# Patient Record
Sex: Female | Born: 1935 | Race: White | Hispanic: No | Smoking: Never smoker
Health system: Southern US, Community
[De-identification: ages and names within clinical notes are randomized; demographics above are authoritative.]

## PROBLEM LIST (undated history)

## (undated) DIAGNOSIS — R911 Solitary pulmonary nodule: Secondary | ICD-10-CM

## (undated) DIAGNOSIS — G629 Polyneuropathy, unspecified: Secondary | ICD-10-CM

## (undated) DIAGNOSIS — E785 Hyperlipidemia, unspecified: Secondary | ICD-10-CM

## (undated) DIAGNOSIS — E039 Hypothyroidism, unspecified: Secondary | ICD-10-CM

## (undated) HISTORY — PX: ABDOMINAL HYSTERECTOMY: SHX81

---
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
## 1988-10-04 HISTORY — PX: BREAST EXCISIONAL BIOPSY: SUR124

## 2015-08-08 ENCOUNTER — Other Ambulatory Visit: Payer: Self-pay | Admitting: Unknown Physician Specialty

## 2015-08-08 ENCOUNTER — Ambulatory Visit
Admission: RE | Admit: 2015-08-08 | Discharge: 2015-08-08 | Disposition: A | Discharge status: Home / Self Care | Payer: Medicare Other | Source: Ambulatory Visit | Attending: Unknown Physician Specialty | Admitting: Unknown Physician Specialty

## 2015-08-08 DIAGNOSIS — D175 Benign lipomatous neoplasm of intra-abdominal organs: Secondary | ICD-10-CM | POA: Insufficient documentation

## 2015-08-08 DIAGNOSIS — Z90710 Acquired absence of both cervix and uterus: Secondary | ICD-10-CM | POA: Insufficient documentation

## 2015-08-08 DIAGNOSIS — K5730 Diverticulosis of large intestine without perforation or abscess without bleeding: Secondary | ICD-10-CM | POA: Insufficient documentation

## 2015-08-08 DIAGNOSIS — K760 Fatty (change of) liver, not elsewhere classified: Secondary | ICD-10-CM | POA: Insufficient documentation

## 2015-08-08 DIAGNOSIS — R1031 Right lower quadrant pain: Secondary | ICD-10-CM

## 2015-08-08 LAB — POCT I-STAT CREATININE: Creatinine, Ser: 1.3 mg/dL — ABNORMAL HIGH (ref 0.44–1.00)

## 2015-08-08 MED ORDER — IOHEXOL 300 MG/ML  SOLN
100.0000 mL | Freq: Once | INTRAMUSCULAR | Status: AC | PRN
  Administered 2015-08-08: 100 mL via INTRAVENOUS

## 2015-08-11 DIAGNOSIS — I071 Rheumatic tricuspid insufficiency: Secondary | ICD-10-CM | POA: Insufficient documentation

## 2015-08-13 DIAGNOSIS — I10 Essential (primary) hypertension: Secondary | ICD-10-CM | POA: Insufficient documentation

## 2015-08-13 DIAGNOSIS — E782 Mixed hyperlipidemia: Secondary | ICD-10-CM | POA: Insufficient documentation

## 2015-08-21 DIAGNOSIS — R911 Solitary pulmonary nodule: Secondary | ICD-10-CM | POA: Insufficient documentation

## 2015-09-11 ENCOUNTER — Other Ambulatory Visit: Payer: Self-pay | Admitting: Specialist

## 2015-09-11 DIAGNOSIS — R918 Other nonspecific abnormal finding of lung field: Secondary | ICD-10-CM

## 2015-11-19 DIAGNOSIS — M19041 Primary osteoarthritis, right hand: Secondary | ICD-10-CM | POA: Insufficient documentation

## 2015-11-19 DIAGNOSIS — I8390 Asymptomatic varicose veins of unspecified lower extremity: Secondary | ICD-10-CM | POA: Insufficient documentation

## 2016-03-04 ENCOUNTER — Ambulatory Visit
Admission: RE | Admit: 2016-03-04 | Discharge: 2016-03-04 | Disposition: A | Discharge status: Home / Self Care | Payer: Medicare Other | Source: Ambulatory Visit | Attending: Specialist | Admitting: Specialist

## 2016-03-04 DIAGNOSIS — R918 Other nonspecific abnormal finding of lung field: Secondary | ICD-10-CM | POA: Diagnosis present

## 2016-03-04 DIAGNOSIS — J219 Acute bronchiolitis, unspecified: Secondary | ICD-10-CM | POA: Diagnosis not present

## 2016-03-04 DIAGNOSIS — J470 Bronchiectasis with acute lower respiratory infection: Secondary | ICD-10-CM | POA: Diagnosis not present

## 2016-03-04 DIAGNOSIS — I2510 Atherosclerotic heart disease of native coronary artery without angina pectoris: Secondary | ICD-10-CM | POA: Insufficient documentation

## 2016-03-10 ENCOUNTER — Other Ambulatory Visit: Payer: Self-pay | Admitting: Specialist

## 2016-03-10 DIAGNOSIS — J479 Bronchiectasis, uncomplicated: Secondary | ICD-10-CM

## 2016-03-10 DIAGNOSIS — R0602 Shortness of breath: Secondary | ICD-10-CM

## 2016-03-10 DIAGNOSIS — R918 Other nonspecific abnormal finding of lung field: Secondary | ICD-10-CM

## 2016-04-29 DIAGNOSIS — J479 Bronchiectasis, uncomplicated: Secondary | ICD-10-CM | POA: Insufficient documentation

## 2016-05-10 ENCOUNTER — Ambulatory Visit: Payer: Medicare Other

## 2016-06-01 ENCOUNTER — Other Ambulatory Visit: Payer: Self-pay | Admitting: Internal Medicine

## 2016-06-01 DIAGNOSIS — Z1231 Encounter for screening mammogram for malignant neoplasm of breast: Secondary | ICD-10-CM

## 2016-06-08 ENCOUNTER — Ambulatory Visit
Admission: RE | Admit: 2016-06-08 | Discharge: 2016-06-08 | Disposition: A | Discharge status: Home / Self Care | Payer: Medicare Other | Source: Ambulatory Visit | Attending: Specialist | Admitting: Specialist

## 2016-06-08 DIAGNOSIS — I2510 Atherosclerotic heart disease of native coronary artery without angina pectoris: Secondary | ICD-10-CM | POA: Diagnosis not present

## 2016-06-08 DIAGNOSIS — I700 Atherosclerosis of aorta: Secondary | ICD-10-CM | POA: Insufficient documentation

## 2016-06-08 DIAGNOSIS — J984 Other disorders of lung: Secondary | ICD-10-CM | POA: Diagnosis not present

## 2016-06-08 DIAGNOSIS — R918 Other nonspecific abnormal finding of lung field: Secondary | ICD-10-CM | POA: Diagnosis not present

## 2016-06-08 DIAGNOSIS — J479 Bronchiectasis, uncomplicated: Secondary | ICD-10-CM

## 2016-06-08 DIAGNOSIS — R0602 Shortness of breath: Secondary | ICD-10-CM | POA: Diagnosis present

## 2016-06-08 DIAGNOSIS — R05 Cough: Secondary | ICD-10-CM | POA: Diagnosis present

## 2016-06-15 ENCOUNTER — Ambulatory Visit
Admission: RE | Admit: 2016-06-15 | Discharge: 2016-06-15 | Disposition: A | Discharge status: Home / Self Care | Payer: Medicare Other | Source: Ambulatory Visit | Attending: Internal Medicine | Admitting: Internal Medicine

## 2016-06-15 ENCOUNTER — Other Ambulatory Visit: Payer: Self-pay | Admitting: Internal Medicine

## 2016-06-15 DIAGNOSIS — Z1231 Encounter for screening mammogram for malignant neoplasm of breast: Secondary | ICD-10-CM | POA: Diagnosis present

## 2016-06-18 ENCOUNTER — Ambulatory Visit: Payer: Medicare Other

## 2016-07-15 ENCOUNTER — Other Ambulatory Visit: Payer: Self-pay | Admitting: Specialist

## 2016-07-15 DIAGNOSIS — R911 Solitary pulmonary nodule: Secondary | ICD-10-CM

## 2016-08-05 ENCOUNTER — Ambulatory Visit: Payer: Medicare Other | Admitting: Occupational Therapy

## 2016-12-02 ENCOUNTER — Other Ambulatory Visit: Payer: Self-pay | Admitting: Specialist

## 2016-12-02 DIAGNOSIS — R911 Solitary pulmonary nodule: Secondary | ICD-10-CM

## 2017-04-05 ENCOUNTER — Ambulatory Visit: Payer: Medicare Other

## 2017-06-06 ENCOUNTER — Ambulatory Visit: Payer: Medicare Other

## 2017-06-07 ENCOUNTER — Ambulatory Visit
Admission: RE | Admit: 2017-06-07 | Discharge: 2017-06-07 | Disposition: A | Discharge status: Home / Self Care | Payer: Medicare Other | Source: Ambulatory Visit | Attending: Specialist | Admitting: Specialist

## 2017-06-07 DIAGNOSIS — I700 Atherosclerosis of aorta: Secondary | ICD-10-CM | POA: Diagnosis not present

## 2017-06-07 DIAGNOSIS — R911 Solitary pulmonary nodule: Secondary | ICD-10-CM | POA: Diagnosis present

## 2017-06-07 DIAGNOSIS — R918 Other nonspecific abnormal finding of lung field: Secondary | ICD-10-CM | POA: Diagnosis not present

## 2017-06-07 DIAGNOSIS — I2510 Atherosclerotic heart disease of native coronary artery without angina pectoris: Secondary | ICD-10-CM | POA: Diagnosis not present

## 2017-07-20 ENCOUNTER — Other Ambulatory Visit: Payer: Self-pay | Admitting: Internal Medicine

## 2017-07-20 DIAGNOSIS — Z1231 Encounter for screening mammogram for malignant neoplasm of breast: Secondary | ICD-10-CM

## 2017-08-03 ENCOUNTER — Ambulatory Visit
Admission: RE | Admit: 2017-08-03 | Discharge: 2017-08-03 | Disposition: A | Discharge status: Home / Self Care | Payer: Medicare Other | Source: Ambulatory Visit | Attending: Internal Medicine | Admitting: Internal Medicine

## 2017-08-03 DIAGNOSIS — Z1231 Encounter for screening mammogram for malignant neoplasm of breast: Secondary | ICD-10-CM | POA: Insufficient documentation

## 2017-08-04 ENCOUNTER — Other Ambulatory Visit: Payer: Self-pay | Admitting: Internal Medicine

## 2017-08-04 DIAGNOSIS — N6489 Other specified disorders of breast: Secondary | ICD-10-CM

## 2017-08-04 DIAGNOSIS — R928 Other abnormal and inconclusive findings on diagnostic imaging of breast: Secondary | ICD-10-CM

## 2017-08-12 ENCOUNTER — Ambulatory Visit
Admission: RE | Admit: 2017-08-12 | Discharge: 2017-08-12 | Disposition: A | Discharge status: Home / Self Care | Payer: Medicare Other | Source: Ambulatory Visit | Attending: Internal Medicine | Admitting: Internal Medicine

## 2017-08-12 DIAGNOSIS — R928 Other abnormal and inconclusive findings on diagnostic imaging of breast: Secondary | ICD-10-CM | POA: Insufficient documentation

## 2017-08-12 DIAGNOSIS — N6489 Other specified disorders of breast: Secondary | ICD-10-CM | POA: Diagnosis not present

## 2017-08-16 ENCOUNTER — Other Ambulatory Visit: Payer: Self-pay | Admitting: Internal Medicine

## 2017-08-16 DIAGNOSIS — R928 Other abnormal and inconclusive findings on diagnostic imaging of breast: Secondary | ICD-10-CM

## 2017-08-16 DIAGNOSIS — N6310 Unspecified lump in the right breast, unspecified quadrant: Secondary | ICD-10-CM

## 2017-08-18 ENCOUNTER — Other Ambulatory Visit: Payer: Self-pay | Admitting: *Deleted

## 2017-08-18 ENCOUNTER — Inpatient Hospital Stay
Admission: RE | Admit: 2017-08-18 | Discharge: 2017-08-18 | Disposition: A | Discharge status: Home / Self Care | Payer: Self-pay | Source: Ambulatory Visit | Attending: *Deleted | Admitting: *Deleted

## 2017-08-18 DIAGNOSIS — Z9289 Personal history of other medical treatment: Secondary | ICD-10-CM

## 2017-09-01 ENCOUNTER — Other Ambulatory Visit: Payer: Self-pay | Admitting: Internal Medicine

## 2017-09-01 ENCOUNTER — Ambulatory Visit
Admission: RE | Admit: 2017-09-01 | Discharge: 2017-09-01 | Disposition: A | Discharge status: Home / Self Care | Payer: Medicare Other | Source: Ambulatory Visit | Attending: Internal Medicine | Admitting: Internal Medicine

## 2017-09-01 DIAGNOSIS — R600 Localized edema: Secondary | ICD-10-CM

## 2017-09-01 DIAGNOSIS — I340 Nonrheumatic mitral (valve) insufficiency: Secondary | ICD-10-CM | POA: Insufficient documentation

## 2017-09-01 DIAGNOSIS — I8001 Phlebitis and thrombophlebitis of superficial vessels of right lower extremity: Secondary | ICD-10-CM | POA: Insufficient documentation

## 2017-09-02 ENCOUNTER — Ambulatory Visit
Admission: RE | Admit: 2017-09-02 | Discharge: 2017-09-02 | Disposition: A | Discharge status: Home / Self Care | Payer: Medicare Other | Source: Ambulatory Visit | Attending: Internal Medicine | Admitting: Internal Medicine

## 2017-09-02 DIAGNOSIS — N6310 Unspecified lump in the right breast, unspecified quadrant: Secondary | ICD-10-CM

## 2017-09-02 DIAGNOSIS — R928 Other abnormal and inconclusive findings on diagnostic imaging of breast: Secondary | ICD-10-CM

## 2017-12-25 ENCOUNTER — Emergency Department
Admission: EM | Admit: 2017-12-25 | Discharge: 2017-12-25 | Disposition: A | Discharge status: Home / Self Care | Payer: No Typology Code available for payment source | Attending: Emergency Medicine | Admitting: Emergency Medicine

## 2017-12-25 ENCOUNTER — Emergency Department: Payer: No Typology Code available for payment source

## 2017-12-25 DIAGNOSIS — Y999 Unspecified external cause status: Secondary | ICD-10-CM | POA: Insufficient documentation

## 2017-12-25 DIAGNOSIS — Y9389 Activity, other specified: Secondary | ICD-10-CM | POA: Diagnosis not present

## 2017-12-25 DIAGNOSIS — S299XXA Unspecified injury of thorax, initial encounter: Secondary | ICD-10-CM | POA: Diagnosis present

## 2017-12-25 DIAGNOSIS — S40212A Abrasion of left shoulder, initial encounter: Secondary | ICD-10-CM | POA: Diagnosis not present

## 2017-12-25 DIAGNOSIS — Y92410 Unspecified street and highway as the place of occurrence of the external cause: Secondary | ICD-10-CM | POA: Insufficient documentation

## 2017-12-25 DIAGNOSIS — S20219A Contusion of unspecified front wall of thorax, initial encounter: Secondary | ICD-10-CM

## 2017-12-25 DIAGNOSIS — S20211A Contusion of right front wall of thorax, initial encounter: Secondary | ICD-10-CM | POA: Insufficient documentation

## 2017-12-25 LAB — PROTIME-INR
INR: 1.06
Prothrombin Time: 13.7 seconds (ref 11.4–15.2)

## 2017-12-25 LAB — CBC WITH DIFFERENTIAL/PLATELET
Basophils Absolute: 0 10*3/uL (ref 0–0.1)
Basophils Relative: 0 %
Eosinophils Absolute: 0.1 10*3/uL (ref 0–0.7)
Eosinophils Relative: 1 %
HEMATOCRIT: 44.8 % (ref 35.0–47.0)
HEMOGLOBIN: 14.3 g/dL (ref 12.0–16.0)
LYMPHS PCT: 13 %
Lymphs Abs: 1.6 10*3/uL (ref 1.0–3.6)
MCH: 30.3 pg (ref 26.0–34.0)
MCHC: 32 g/dL (ref 32.0–36.0)
MCV: 94.8 fL (ref 80.0–100.0)
MONO ABS: 1 10*3/uL — AB (ref 0.2–0.9)
MONOS PCT: 8 %
NEUTROS ABS: 9.6 10*3/uL — AB (ref 1.4–6.5)
Neutrophils Relative %: 78 %
Platelets: 312 10*3/uL (ref 150–440)
RBC: 4.72 MIL/uL (ref 3.80–5.20)
RDW: 14.3 % (ref 11.5–14.5)
WBC: 12.3 10*3/uL — ABNORMAL HIGH (ref 3.6–11.0)

## 2017-12-25 LAB — BASIC METABOLIC PANEL
ANION GAP: 9 (ref 5–15)
BUN: 23 mg/dL — ABNORMAL HIGH (ref 6–20)
CHLORIDE: 104 mmol/L (ref 101–111)
CO2: 26 mmol/L (ref 22–32)
Calcium: 9.9 mg/dL (ref 8.9–10.3)
Creatinine, Ser: 1.48 mg/dL — ABNORMAL HIGH (ref 0.44–1.00)
GFR calc Af Amer: 37 mL/min — ABNORMAL LOW (ref >60)
GFR calc non Af Amer: 32 mL/min — ABNORMAL LOW (ref >60)
GLUCOSE: 112 mg/dL — AB (ref 65–99)
Potassium: 4.3 mmol/L (ref 3.5–5.1)
Sodium: 139 mmol/L (ref 135–145)

## 2017-12-25 MED ORDER — OXYCODONE-ACETAMINOPHEN 5-325 MG PO TABS: 1.0000 | ORAL_TABLET | ORAL | 0 refills | Status: DC | PRN

## 2017-12-25 MED ORDER — IOPAMIDOL (ISOVUE-300) INJECTION 61%
75.0000 mL | Freq: Once | INTRAVENOUS | Status: AC | PRN
  Administered 2017-12-25: 75 mL via INTRAVENOUS

## 2017-12-25 MED ORDER — ACETAMINOPHEN 325 MG PO TABS
650.0000 mg | ORAL_TABLET | Freq: Once | ORAL | Status: AC
  Administered 2017-12-25: 650 mg via ORAL
  Filled 2017-12-25: qty 2

## 2017-12-25 NOTE — ED Notes (Signed)
Pt discharged to home.  Discharge instructions reviewed.  Verbalized understanding.  No questions or concerns at this time.  Teach back verified.  Pt in NAD.  No items left in ED.   
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
## 2017-12-25 NOTE — ED Notes (Signed)
Pt given Incentive spirometer and instructed on how to use. Pt verbalized understanding.

## 2018-01-31 ENCOUNTER — Other Ambulatory Visit: Payer: Self-pay | Admitting: Neurology

## 2018-01-31 DIAGNOSIS — R404 Transient alteration of awareness: Secondary | ICD-10-CM

## 2018-02-03 ENCOUNTER — Ambulatory Visit
Admission: RE | Admit: 2018-02-03 | Discharge: 2018-02-03 | Disposition: A | Discharge status: Home / Self Care | Payer: Medicare Other | Source: Ambulatory Visit | Attending: Neurology | Admitting: Neurology

## 2018-02-03 DIAGNOSIS — R404 Transient alteration of awareness: Secondary | ICD-10-CM | POA: Insufficient documentation

## 2018-02-03 DIAGNOSIS — G319 Degenerative disease of nervous system, unspecified: Secondary | ICD-10-CM | POA: Diagnosis not present

## 2018-02-03 DIAGNOSIS — I739 Peripheral vascular disease, unspecified: Secondary | ICD-10-CM | POA: Insufficient documentation

## 2018-02-06 ENCOUNTER — Ambulatory Visit: Payer: No Typology Code available for payment source

## 2018-03-02 ENCOUNTER — Other Ambulatory Visit: Payer: Self-pay | Admitting: Internal Medicine

## 2018-03-02 ENCOUNTER — Ambulatory Visit: Payer: Medicare Other

## 2018-03-02 ENCOUNTER — Ambulatory Visit
Admission: RE | Admit: 2018-03-02 | Discharge: 2018-03-02 | Disposition: A | Discharge status: Home / Self Care | Payer: Medicare Other | Source: Ambulatory Visit | Attending: Internal Medicine | Admitting: Internal Medicine

## 2018-03-02 DIAGNOSIS — S0990XA Unspecified injury of head, initial encounter: Secondary | ICD-10-CM

## 2018-03-02 DIAGNOSIS — X58XXXA Exposure to other specified factors, initial encounter: Secondary | ICD-10-CM | POA: Diagnosis not present

## 2018-03-02 DIAGNOSIS — G319 Degenerative disease of nervous system, unspecified: Secondary | ICD-10-CM | POA: Insufficient documentation

## 2018-03-02 DIAGNOSIS — S0003XA Contusion of scalp, initial encounter: Secondary | ICD-10-CM | POA: Insufficient documentation

## 2018-05-03 DIAGNOSIS — N183 Chronic kidney disease, stage 3 unspecified: Secondary | ICD-10-CM | POA: Insufficient documentation

## 2018-08-10 ENCOUNTER — Other Ambulatory Visit: Payer: Self-pay | Admitting: Internal Medicine

## 2018-08-10 DIAGNOSIS — Z1231 Encounter for screening mammogram for malignant neoplasm of breast: Secondary | ICD-10-CM

## 2018-08-22 ENCOUNTER — Inpatient Hospital Stay
Admission: AD | Admit: 2018-08-22 | Discharge: 2018-08-25 | DRG: 194 | Disposition: A | Discharge status: Home Health | Payer: Medicare Other | Source: Ambulatory Visit | Attending: Internal Medicine | Admitting: Internal Medicine

## 2018-08-22 ENCOUNTER — Inpatient Hospital Stay: Payer: Medicare Other

## 2018-08-22 DIAGNOSIS — Z79890 Hormone replacement therapy (postmenopausal): Secondary | ICD-10-CM

## 2018-08-22 DIAGNOSIS — E785 Hyperlipidemia, unspecified: Secondary | ICD-10-CM | POA: Diagnosis present

## 2018-08-22 DIAGNOSIS — Z23 Encounter for immunization: Secondary | ICD-10-CM | POA: Diagnosis not present

## 2018-08-22 DIAGNOSIS — Z91018 Allergy to other foods: Secondary | ICD-10-CM | POA: Diagnosis not present

## 2018-08-22 DIAGNOSIS — I129 Hypertensive chronic kidney disease with stage 1 through stage 4 chronic kidney disease, or unspecified chronic kidney disease: Secondary | ICD-10-CM | POA: Diagnosis present

## 2018-08-22 DIAGNOSIS — N183 Chronic kidney disease, stage 3 (moderate): Secondary | ICD-10-CM | POA: Diagnosis present

## 2018-08-22 DIAGNOSIS — Z8249 Family history of ischemic heart disease and other diseases of the circulatory system: Secondary | ICD-10-CM

## 2018-08-22 DIAGNOSIS — Z79899 Other long term (current) drug therapy: Secondary | ICD-10-CM

## 2018-08-22 DIAGNOSIS — Z90710 Acquired absence of both cervix and uterus: Secondary | ICD-10-CM

## 2018-08-22 DIAGNOSIS — G9349 Other encephalopathy: Secondary | ICD-10-CM | POA: Diagnosis present

## 2018-08-22 DIAGNOSIS — Z91013 Allergy to seafood: Secondary | ICD-10-CM | POA: Diagnosis not present

## 2018-08-22 DIAGNOSIS — R911 Solitary pulmonary nodule: Secondary | ICD-10-CM | POA: Diagnosis present

## 2018-08-22 DIAGNOSIS — E039 Hypothyroidism, unspecified: Secondary | ICD-10-CM | POA: Diagnosis present

## 2018-08-22 DIAGNOSIS — J189 Pneumonia, unspecified organism: Secondary | ICD-10-CM | POA: Diagnosis present

## 2018-08-22 HISTORY — DX: Hyperlipidemia, unspecified: E78.5

## 2018-08-22 HISTORY — DX: Hypothyroidism, unspecified: E03.9

## 2018-08-22 HISTORY — DX: Solitary pulmonary nodule: R91.1

## 2018-08-22 LAB — CBC
HEMATOCRIT: 42.3 % (ref 36.0–46.0)
HEMOGLOBIN: 13.8 g/dL (ref 12.0–15.0)
MCH: 30.9 pg (ref 26.0–34.0)
MCHC: 32.6 g/dL (ref 30.0–36.0)
MCV: 94.6 fL (ref 80.0–100.0)
Platelets: 390 10*3/uL (ref 150–400)
RBC: 4.47 MIL/uL (ref 3.87–5.11)
RDW: 13.4 % (ref 11.5–15.5)
WBC: 31.2 10*3/uL — ABNORMAL HIGH (ref 4.0–10.5)
nRBC: 0 % (ref 0.0–0.2)

## 2018-08-22 LAB — COMPREHENSIVE METABOLIC PANEL
ALK PHOS: 212 U/L — AB (ref 38–126)
ALT: 23 U/L (ref 0–44)
AST: 41 U/L (ref 15–41)
Albumin: 2.6 g/dL — ABNORMAL LOW (ref 3.5–5.0)
Anion gap: 10 (ref 5–15)
BILIRUBIN TOTAL: 1.4 mg/dL — AB (ref 0.3–1.2)
BUN: 45 mg/dL — ABNORMAL HIGH (ref 8–23)
CALCIUM: 9.2 mg/dL (ref 8.9–10.3)
CO2: 27 mmol/L (ref 22–32)
CREATININE: 1.46 mg/dL — AB (ref 0.44–1.00)
Chloride: 101 mmol/L (ref 98–111)
GFR, EST AFRICAN AMERICAN: 38 mL/min — AB (ref >60)
GFR, EST NON AFRICAN AMERICAN: 33 mL/min — AB (ref >60)
Glucose, Bld: 113 mg/dL — ABNORMAL HIGH (ref 70–99)
Potassium: 3.5 mmol/L (ref 3.5–5.1)
SODIUM: 138 mmol/L (ref 135–145)
TOTAL PROTEIN: 7.1 g/dL (ref 6.5–8.1)

## 2018-08-22 MED ORDER — DOCUSATE SODIUM 100 MG PO CAPS: 100.0000 mg | ORAL_CAPSULE | Freq: Two times a day (BID) | ORAL | Status: DC | PRN

## 2018-08-22 MED ORDER — PNEUMOCOCCAL VAC POLYVALENT 25 MCG/0.5ML IJ INJ
0.5000 mL | INJECTION | INTRAMUSCULAR | Status: DC
  Filled 2018-08-22: qty 0.5

## 2018-08-22 MED ORDER — OXYBUTYNIN CHLORIDE ER 5 MG PO TB24
5.0000 mg | ORAL_TABLET | Freq: Every day | ORAL | Status: DC
  Filled 2018-08-22 (×3): qty 1

## 2018-08-22 MED ORDER — LEVOTHYROXINE SODIUM 100 MCG PO TABS
100.0000 ug | ORAL_TABLET | Freq: Every day | ORAL | Status: DC
  Administered 2018-08-23 – 2018-08-25 (×3): 100 ug via ORAL
  Filled 2018-08-22 (×3): qty 1

## 2018-08-22 MED ORDER — ATENOLOL 50 MG PO TABS
50.0000 mg | ORAL_TABLET | Freq: Every day | ORAL | Status: DC
  Administered 2018-08-23 – 2018-08-25 (×3): 50 mg via ORAL
  Filled 2018-08-22 (×4): qty 1

## 2018-08-22 MED ORDER — ATORVASTATIN CALCIUM 20 MG PO TABS
40.0000 mg | ORAL_TABLET | Freq: Every day | ORAL | Status: DC
  Administered 2018-08-23 – 2018-08-25 (×3): 40 mg via ORAL
  Filled 2018-08-22 (×3): qty 2

## 2018-08-22 MED ORDER — DEXTROSE 5 % IV SOLN
250.0000 mg | INTRAVENOUS | Status: DC
  Administered 2018-08-22: 22:00:00 250 mg via INTRAVENOUS
  Filled 2018-08-22 (×2): qty 250

## 2018-08-22 MED ORDER — HEPARIN SODIUM (PORCINE) 5000 UNIT/ML IJ SOLN
5000.0000 [IU] | Freq: Three times a day (TID) | INTRAMUSCULAR | Status: DC
  Administered 2018-08-22 – 2018-08-25 (×7): 5000 [IU] via SUBCUTANEOUS
  Filled 2018-08-22 (×7): qty 1

## 2018-08-22 MED ORDER — GUAIFENESIN-CODEINE 100-10 MG/5ML PO SOLN
5.0000 mL | Freq: Four times a day (QID) | ORAL | Status: DC | PRN
  Administered 2018-08-22 – 2018-08-24 (×4): 5 mL via ORAL
  Filled 2018-08-22 (×4): qty 5

## 2018-08-22 MED ORDER — OXYCODONE-ACETAMINOPHEN 5-325 MG PO TABS: 1.0000 | ORAL_TABLET | ORAL | Status: DC | PRN

## 2018-08-22 MED ORDER — ACETAMINOPHEN 325 MG PO TABS: 650.0000 mg | ORAL_TABLET | Freq: Four times a day (QID) | ORAL | Status: DC | PRN

## 2018-08-22 MED ORDER — SPIRONOLACTONE 25 MG PO TABS
25.0000 mg | ORAL_TABLET | Freq: Every day | ORAL | Status: DC
  Administered 2018-08-23 – 2018-08-25 (×3): 25 mg via ORAL
  Filled 2018-08-22 (×3): qty 1

## 2018-08-22 MED ORDER — SODIUM CHLORIDE 0.9 % IV SOLN
1.0000 g | INTRAVENOUS | Status: DC
  Administered 2018-08-22 – 2018-08-24 (×3): 1 g via INTRAVENOUS
  Filled 2018-08-22: qty 1
  Filled 2018-08-22: qty 10
  Filled 2018-08-22 (×2): qty 1

## 2018-08-22 NOTE — H&P (Signed)
[place] at [place] NAME: Jasmine Butler    MR#:  [id number]  DATE OF BIRTH:  1935-09-12  DATE OF ADMISSION:  08/22/2018  PRIMARY CARE PHYSICIAN: Baxter Hire, MD   REQUESTING/REFERRING PHYSICIAN: Edwina Barth  CHIEF COMPLAINT:  No chief complaint on file.   HISTORY OF PRESENT ILLNESS: Jasmine Butler  is a 83 y.o. female with a known history of hyperlipidemia, hypothyroidism, pulmonary nodule-was having her regular physical appointment with her primary care physician today.  She went for work-up last week to give her blood sample at her doctor's office.  At that time she was not feeling too good and she mentioned that to the staff, but she was sent home after getting the blood samples Today when she went to see Dr. Edwina Barth she complained that since last week she is having cough, generalized weakness, shortness of breath and now she is also having yellowish-greenish sputum production. He did an x-ray in the office which reported having pneumonia. She was not hypoxic or hypotensive but had some tachycardia and so PMD called in for direct admission. Patient denies any fever but she had some chills  PAST MEDICAL HISTORY:   Past Medical History:  Diagnosis Date  . Hyperlipidemia   . Hypothyroidism   . Pulmonary nodule     PAST SURGICAL HISTORY:  Past Surgical History:  Procedure Laterality Date  . ABDOMINAL HYSTERECTOMY    . BREAST BIOPSY Right 09/02/2017   Affirm x- path pending  . BREAST EXCISIONAL BIOPSY Right 1990   NEG    SOCIAL HISTORY:  Social History   Tobacco Use  . Smoking status: Never Smoker  . Smokeless tobacco: Never Used  Substance Use Topics  . Alcohol use: Not Currently    FAMILY HISTORY:  Family History  Problem Relation Age of Onset  . Hypertension Mother   . Breast cancer Neg Hx     DRUG ALLERGIES:  Allergies  Allergen Reactions  . Other     Chicken/poultry  . Shellfish Allergy     REVIEW OF SYSTEMS:    CONSTITUTIONAL: No fever, have fatigue or weakness.  EYES: No blurred or double vision.  EARS, NOSE, AND THROAT: No tinnitus or ear pain.  RESPIRATORY: Have cough, shortness of breath, no wheezing or hemoptysis.  CARDIOVASCULAR: No chest pain, orthopnea, edema.  GASTROINTESTINAL: No nausea, vomiting, diarrhea or abdominal pain.  GENITOURINARY: No dysuria, hematuria.  ENDOCRINE: No polyuria, nocturia,  HEMATOLOGY: No anemia, easy bruising or bleeding SKIN: No rash or lesion. MUSCULOSKELETAL: No joint pain or arthritis.   NEUROLOGIC: No tingling, numbness, weakness.  PSYCHIATRY: No anxiety or depression.   MEDICATIONS AT HOME:  Prior to Admission medications   Medication Sig Start Date End Date Taking? Authorizing Provider  atenolol (TENORMIN) 50 MG tablet Take 50 mg by mouth daily. 11/23/17   [provider]  atorvastatin (LIPITOR) 40 MG tablet Take 40 mg by mouth daily. 11/23/17   [provider]  oxybutynin (DITROPAN-XL) 5 MG 24 hr tablet Take 5 mg by mouth daily. 11/30/17   [provider]  oxyCODONE-acetaminophen (PERCOCET) 5-325 MG tablet Take 1 tablet by mouth every 4 (four) hours as needed for severe pain. 12/25/17   Schuyler Amor, MD  spironolactone (ALDACTONE) 25 MG tablet Take 25 mg by mouth daily. 11/30/17   [provider]  SYNTHROID 100 MCG tablet Take 1 tablet by mouth daily. 11/23/17   [provider]      PHYSICAL EXAMINATION:  VITAL SIGNS: Blood pressure (!) 92/59, pulse (!) 108, temperature 97.6 F (36.4 C), temperature source Oral, resp. rate 20, SpO2 92 %.  GENERAL:  83 y.o.-year-old patient lying in the bed with no acute distress.  EYES: Pupils equal, round, reactive to light and accommodation. No scleral icterus. Extraocular muscles intact.  HEENT: Head atraumatic, normocephalic. Oropharynx and nasopharynx clear.  NECK:  Supple, no jugular venous distention. No thyroid enlargement, no tenderness.  LUNGS: Normal  breath sounds bilaterally, no wheezing, some crepitation. No use of accessory muscles of respiration.  CARDIOVASCULAR: S1, S2 normal. No murmurs, rubs, or gallops.  ABDOMEN: Soft, nontender, nondistended. Bowel sounds present. No organomegaly or mass.  EXTREMITIES: No pedal edema, cyanosis, or clubbing.  NEUROLOGIC: Cranial nerves II through XII are intact. Muscle strength 4/5 in all extremities. Sensation intact. Gait not checked.  PSYCHIATRIC: The patient is alert and oriented x 3.  SKIN: No obvious rash, lesion, or ulcer.   LABORATORY PANEL:   CBC Recent Labs  Lab 08/22/18 1732  WBC 31.2*  HGB 13.8  HCT 42.3  PLT 390  MCV 94.6  MCH 30.9  MCHC 32.6  RDW 13.4   ------------------------------------------------------------------------------------------------------------------  Chemistries  Recent Labs  Lab 08/22/18 1732  NA 138  K 3.5  CL 101  CO2 27  GLUCOSE 113*  BUN 45*  CREATININE 1.46*  CALCIUM 9.2  AST 41  ALT 23  ALKPHOS 212*  BILITOT 1.4*   ------------------------------------------------------------------------------------------------------------------ CrCl cannot be calculated (Unknown ideal weight.). ------------------------------------------------------------------------------------------------------------------ No results for input(s): TSH, T4TOTAL, T3FREE, THYROIDAB in the last 72 hours.  Invalid input(s): FREET3   Coagulation profile No results for input(s): INR, PROTIME in the last 168 hours. ------------------------------------------------------------------------------------------------------------------- No results for input(s): DDIMER in the last 72 hours. -------------------------------------------------------------------------------------------------------------------  Cardiac Enzymes No results for input(s): CKMB, TROPONINI, MYOGLOBIN in the last 168 hours.  Invalid input(s):  CK ------------------------------------------------------------------------------------------------------------------ Invalid input(s): POCBNP  ---------------------------------------------------------------------------------------------------------------  Urinalysis No results found for: COLORURINE, APPEARANCEUR, LABSPEC, PHURINE, GLUCOSEU, HGBUR, BILIRUBINUR, KETONESUR, PROTEINUR, UROBILINOGEN, NITRITE, LEUKOCYTESUR   RADIOLOGY: Dg Chest 2 View  Result Date: 08/22/2018 CLINICAL DATA:  Pneumonia. EXAM: CHEST - 2 VIEW COMPARISON:  None. FINDINGS: A right upper lobe pneumonia is identified. The heart, hila, mediastinum, lungs, and pleura are otherwise unremarkable. IMPRESSION: Right middle lobe infiltrate, likely pneumonia. Recommend follow-up to resolution. Electronically Signed   By: Dorise Bullion III M.D   On: 08/22/2018 19:24    EKG: No orders found for this or any previous visit.  IMPRESSION AND PLAN:  *Community-acquired pneumonia Ceftriaxone and azithromycin Tylenol as needed for fever. Cough syrup for symptomatic relief.   *Hypertension Continue atenolol.  *Hyperlipidemia Continue atorvastatin.  *Hypothyroidism Continue levothyroxine.  *Chronic kidney disease stage III This appears to be at baseline currently as compared to previous records. Continue to monitor.  All the records are reviewed and case discussed with ED provider. Management plans discussed with the patient, family and they are in agreement.  CODE STATUS: Full code.    Code Status Orders  (From admission, onward)         Start     Ordered   08/22/18 1824  Full code  Continuous     08/22/18 1823        Code Status History    This patient has a current code status but no historical code status.       TOTAL TIME TAKING CARE OF THIS PATIENT: 50* minutes.    Vaughan Basta M.D on 08/22/2018   Between 7am to  6pm - Pager - [phone]  After 6pm go to [email or web address] -  password [email or web address] [place] [facility]  Office  [phone]  CC: Primary care physician; Baxter Hire, MD   Note: This dictation was prepared with Dragon dictation along with smaller phrase technology. Any transcriptional errors that result from this process are unintentional.

## 2018-08-22 NOTE — Progress Notes (Signed)
Family Meeting Note  Advance Directive:yes  Today a meeting took place with the Patient.   The following clinical team members were present during this meeting:MD  The following were discussed:Patient's diagnosis: Pneumonia, chronic renal failure, Patient's progosis: Unable to determine and Goals for treatment: Full Code  Additional follow-up to be provided: Primary care physician  Time spent during discussion:20 minutes  Vaughan Basta, MD

## 2018-08-23 ENCOUNTER — Other Ambulatory Visit: Payer: Self-pay

## 2018-08-23 LAB — BASIC METABOLIC PANEL
Anion gap: 9 (ref 5–15)
BUN: 43 mg/dL — AB (ref 8–23)
CALCIUM: 8.9 mg/dL (ref 8.9–10.3)
CO2: 27 mmol/L (ref 22–32)
CREATININE: 1.14 mg/dL — AB (ref 0.44–1.00)
Chloride: 103 mmol/L (ref 98–111)
GFR calc Af Amer: 51 mL/min — ABNORMAL LOW (ref >60)
GFR, EST NON AFRICAN AMERICAN: 44 mL/min — AB (ref >60)
GLUCOSE: 97 mg/dL (ref 70–99)
Potassium: 3.5 mmol/L (ref 3.5–5.1)
Sodium: 139 mmol/L (ref 135–145)

## 2018-08-23 LAB — CBC
HCT: 39.2 % (ref 36.0–46.0)
Hemoglobin: 13 g/dL (ref 12.0–15.0)
MCH: 31.2 pg (ref 26.0–34.0)
MCHC: 33.2 g/dL (ref 30.0–36.0)
MCV: 94 fL (ref 80.0–100.0)
PLATELETS: 380 10*3/uL (ref 150–400)
RBC: 4.17 MIL/uL (ref 3.87–5.11)
RDW: 13.2 % (ref 11.5–15.5)
WBC: 27 10*3/uL — ABNORMAL HIGH (ref 4.0–10.5)
nRBC: 0 % (ref 0.0–0.2)

## 2018-08-23 MED ORDER — AZITHROMYCIN 500 MG PO TABS
250.0000 mg | ORAL_TABLET | Freq: Every day | ORAL | Status: DC
  Administered 2018-08-23 – 2018-08-24 (×2): 250 mg via ORAL
  Filled 2018-08-23 (×2): qty 1

## 2018-08-23 MED ORDER — ENSURE ENLIVE PO LIQD
237.0000 mL | Freq: Two times a day (BID) | ORAL | Status: DC
  Administered 2018-08-24: 237 mL via ORAL

## 2018-08-23 MED ORDER — ADULT MULTIVITAMIN W/MINERALS CH
1.0000 | ORAL_TABLET | Freq: Every day | ORAL | Status: DC
  Administered 2018-08-23 – 2018-08-25 (×3): 1 via ORAL
  Filled 2018-08-23 (×3): qty 1

## 2018-08-23 MED ORDER — PNEUMOCOCCAL VAC POLYVALENT 25 MCG/0.5ML IJ INJ: 0.5000 mL | INJECTION | INTRAMUSCULAR | Status: DC

## 2018-08-23 NOTE — Care Management Note (Signed)
Case Management Note  Patient Details  Name: Jasmine Butler MRN: [id number] Date of Birth: [birth year]  Subjective/Objective: Admitted to [facility] with the diagnosis of pneumonia. Lives in an apartment at [place] x 3 years.  Last seen Dr. Harrel Lemon 08/20/18.  Jerene Dilling is l;isted as contact pwerson [phone]).                    Action/Plan: Will continue to follow for discharge plans, if needed.   Expected Discharge Date:  08/24/18               Expected Discharge Plan:     In-House Referral:    yes Discharge planning Services   yes  Post Acute Care Choice:    Choice offered to:     DME Arranged:    DME Agency:     HH Arranged:    HH Agency:     Status of Service:     If discussed at [employer], dates discussed:    Additional Comments:  Shelbie Ammons, RN MSN CCM Care Management [phone] 08/23/2018, 10:43 AM

## 2018-08-23 NOTE — Progress Notes (Signed)
Nutrition Brief Note  Patient identified on the Malnutrition Screening Tool (MST) Report  83 year old female with h/o CKD III admitted with pneumonia and confusion   Met with pt in room today. Pt reports good appetite and oral intake today and pta. Pt eating 75-100% of meals. Per chart, pt is weight stable. Pt reports she is allergic to poultry and shellfish. RD made note in [facility] about allergies. Pt declines oral nutrition supplements as she reports these are "artificial" and she prefers to eat real food.   Wt Readings from Last 15 Encounters:  08/23/18 87.6 kg  12/25/17 88.5 kg    Body mass index is 33.15 kg/m. Patient meets criteria for obesity based on current BMI. Per chart, pt is weight stable pta.   Current diet order is regular, patient is consuming approximately 75-100% of meals at this time. Labs and medications reviewed.   No nutrition interventions warranted at this time. If nutrition issues arise, please consult RD.   Koleen Distance MS, RD, LDN Pager #- [phone] Office#- [phone] After Hours Pager: [phone]

## 2018-08-23 NOTE — Progress Notes (Signed)
[place] at [facility]                                                                                                                                                                                  Patient Demographics   Rodneshia Greenhouse, is a 83 y.o. female, DOB - [birth year], ZCH:[id number]  Admit date - 08/22/2018   Admitting Physician Vaughan Basta, MD  Outpatient Primary MD for the patient is Baxter Hire, MD   LOS - 1  Subjective: Patient still with some confusion    Review of Systems:   CONSTITUTIONAL: Unable to provide due to confusion  Vitals:   Vitals:   08/22/18 1937 08/23/18 0511 08/23/18 0513 08/23/18 1005  BP: (!) 92/59 106/81  114/64  Pulse: (!) 108 (!) 107  89  Resp: 20 20  20   Temp: 97.6 F (36.4 C) 98 F (36.7 C)  98.2 F (36.8 C)  TempSrc: Oral Oral  Oral  SpO2: 92% 91%  92%  Weight:   87.6 kg   Height:   5\' 4"  (1.626 m)     Wt Readings from Last 3 Encounters:  08/23/18 87.6 kg  12/25/17 88.5 kg     Intake/Output Summary (Last 24 hours) at 08/23/2018 1407 Last data filed at 08/23/2018 0300 Gross per 24 hour  Intake 217.15 ml  Output -  Net 217.15 ml    Physical Exam:   GENERAL: Pleasant-appearing in no apparent distress.  HEAD, EYES, EARS, NOSE AND THROAT: Atraumatic, normocephalic. Extraocular muscles are intact. Pupils equal and reactive to light. Sclerae anicteric. No conjunctival injection. No oro-pharyngeal erythema.  NECK: Supple. There is no jugular venous distention. No bruits, no lymphadenopathy, no thyromegaly.  HEART: Regular rate and rhythm,. No murmurs, no rubs, no clicks.  LUNGS: Decreased breath sounds without accessory muscle usage  aBDOMEN: Soft, flat, nontender, nondistended. Has good bowel sounds. No hepatosplenomegaly appreciated.  EXTREMITIES: No evidence of any cyanosis, clubbing, or peripheral edema.  +2 pedal and radial pulses bilaterally.  NEUROLOGIC: The patient is alert, awake,  and oriented x3 with no focal motor or sensory deficits appreciated bilaterally.  SKIN: Moist and warm with no rashes appreciated.  Psych: Not anxious, depressed LN: No inguinal LN enlargement    Antibiotics   Anti-infectives (From admission, onward)   Start     Dose/Rate Route Frequency Ordered Stop   08/23/18 1800  azithromycin (ZITHROMAX) tablet 250 mg     250 mg Oral Daily 08/23/18 1211     08/22/18 1815  cefTRIAXone (ROCEPHIN) 1 g in sodium chloride 0.9 % 100 mL IVPB     1 g 200 mL/hr over 30 Minutes Intravenous Every  24 hours 08/22/18 1814     08/22/18 1815  azithromycin (ZITHROMAX) 250 mg in dextrose 5 % 125 mL IVPB  Status:  Discontinued     250 mg 125 mL/hr over 60 Minutes Intravenous Every 24 hours 08/22/18 1814 08/23/18 1211      Medications   Scheduled Meds: . atenolol  50 mg Oral Daily  . atorvastatin  40 mg Oral Daily  . azithromycin  250 mg Oral Daily  . feeding supplement (ENSURE ENLIVE)  237 mL Oral BID BM  . heparin  5,000 Units Subcutaneous Q8H  . levothyroxine  100 mcg Oral Q0600  . multivitamin with minerals  1 tablet Oral Daily  . [START ON 08/24/2018] pneumococcal 23 valent vaccine  0.5 mL Intramuscular Tomorrow-1000  . spironolactone  25 mg Oral Daily   Continuous Infusions: . cefTRIAXone (ROCEPHIN)  IV Stopped (08/23/18 0600)   PRN Meds:.acetaminophen, docusate sodium, guaiFENesin-codeine, oxyCODONE-acetaminophen   Data Review:   Micro Results No results found for this or any previous visit (from the past 240 hour(s)).  Radiology Reports Dg Chest 2 View  Result Date: 08/22/2018 CLINICAL DATA:  Pneumonia. EXAM: CHEST - 2 VIEW COMPARISON:  None. FINDINGS: A right upper lobe pneumonia is identified. The heart, hila, mediastinum, lungs, and pleura are otherwise unremarkable. IMPRESSION: Right middle lobe infiltrate, likely pneumonia. Recommend follow-up to resolution. Electronically Signed   By: Dorise Bullion III M.D   On: 08/22/2018 19:24      CBC Recent Labs  Lab 08/22/18 1732 08/23/18 0459  WBC 31.2* 27.0*  HGB 13.8 13.0  HCT 42.3 39.2  PLT 390 380  MCV 94.6 94.0  MCH 30.9 31.2  MCHC 32.6 33.2  RDW 13.4 13.2    Chemistries  Recent Labs  Lab 08/22/18 1732 08/23/18 0459  NA 138 139  K 3.5 3.5  CL 101 103  CO2 27 27  GLUCOSE 113* 97  BUN 45* 43*  CREATININE 1.46* 1.14*  CALCIUM 9.2 8.9  AST 41  --   ALT 23  --   ALKPHOS 212*  --   BILITOT 1.4*  --    ------------------------------------------------------------------------------------------------------------------ estimated creatinine clearance is 42.2 mL/min (A) (by C-G formula based on SCr of 1.14 mg/dL (H)). ------------------------------------------------------------------------------------------------------------------ No results for input(s): HGBA1C in the last 72 hours. ------------------------------------------------------------------------------------------------------------------ No results for input(s): CHOL, HDL, LDLCALC, TRIG, CHOLHDL, LDLDIRECT in the last 72 hours. ------------------------------------------------------------------------------------------------------------------ No results for input(s): TSH, T4TOTAL, T3FREE, THYROIDAB in the last 72 hours.  Invalid input(s): FREET3 ------------------------------------------------------------------------------------------------------------------ No results for input(s): VITAMINB12, FOLATE, FERRITIN, TIBC, IRON, RETICCTPCT in the last 72 hours.  Coagulation profile No results for input(s): INR, PROTIME in the last 168 hours.  No results for input(s): DDIMER in the last 72 hours.  Cardiac Enzymes No results for input(s): CKMB, TROPONINI, MYOGLOBIN in the last 168 hours.  Invalid input(s): CK ------------------------------------------------------------------------------------------------------------------ Invalid input(s): [place]  Patient is a 83 year old admitted  with pneumonia   *Community-acquired pneumonia Continue ceftriaxone and azithromycin Tylenol as needed for fever. Cough syrup for symptomatic relief.  *Acute encephalopathy due to pneumonia continue supportive care    *Hypertension Continue atenolol.  Heart pressure was low yesterday but today stable  *Hyperlipidemia Continue atorvastatin.  *Hypothyroidism Continue levothyroxine.  *Chronic kidney disease stage III This appears to be at baseline currently as compared to previous records. Continue to monitor.       Code Status Orders  (From admission, onward)         Start  Ordered   08/22/18 1824  Full code  Continuous     08/22/18 1823        Code Status History    This patient has a current code status but no historical code status.           Consults none  DVT Prophylaxis heparin  Lab Results  Component Value Date   PLT 380 08/23/2018     Time Spent in minutes 35 minutes greater than 50% of time spent in care coordination and counseling patient regarding the condition and plan of care.   Dustin Flock M.D on 08/23/2018 at 2:07 PM  Between 7am to 6pm - Pager - [phone]  After 6pm go to [email or web address] - Proofreader  [employer]   Office  [phone]

## 2018-08-24 LAB — CBC
HCT: 38.1 % (ref 36.0–46.0)
Hemoglobin: 12.3 g/dL (ref 12.0–15.0)
MCH: 31 pg (ref 26.0–34.0)
MCHC: 32.3 g/dL (ref 30.0–36.0)
MCV: 96 fL (ref 80.0–100.0)
NRBC: 0.1 % (ref 0.0–0.2)
PLATELETS: 392 10*3/uL (ref 150–400)
RBC: 3.97 MIL/uL (ref 3.87–5.11)
RDW: 13.3 % (ref 11.5–15.5)
WBC: 21.6 10*3/uL — ABNORMAL HIGH (ref 4.0–10.5)

## 2018-08-24 LAB — PROCALCITONIN: Procalcitonin: 1.03 ng/mL

## 2018-08-24 MED ORDER — PNEUMOCOCCAL 13-VAL CONJ VACC IM SUSP
0.5000 mL | INTRAMUSCULAR | Status: DC
  Filled 2018-08-24: qty 0.5

## 2018-08-24 NOTE — Progress Notes (Addendum)
Patient mildly agitated and anxious this morning. Intermittently hostile with family "Anne Ng" on the telephone after RN assisted patient with dialing the number. Requesting her daughter be present or a familiar face. Requested Chaplain for prayers. Consult placed. Patient repeatedly instructing her friend "Anne Ng" on the telephone to not give up, to please believe she is who she is, and that she is in danger. Also asked Anne Ng to please get to [place]. Attempted to console patient and contact Anderson Malta.

## 2018-08-24 NOTE — Progress Notes (Signed)
[place] at [facility]                                                                                                                                                                                  Patient Demographics   Jasmine Butler, is a 83 y.o. female, DOB - Sep 21, 1935, OAC:[id number]  Admit date - 08/22/2018   Admitting Physician Vaughan Basta, MD  Outpatient Primary MD for the patient is Baxter Hire, MD   LOS - 2  Subjective: Patient continues to be confused has some paranoid thinking Clinically improved   Review of Systems:   CONSTITUTIONAL: Unable to provide due to confusion  Vitals:   Vitals:   08/24/18 0509 08/24/18 0510 08/24/18 0802 08/24/18 0930  BP: (!) 109/56  110/60   Pulse: 70 70 67 (!) 108  Resp:      Temp: 97.8 F (36.6 C)  (!) 97.5 F (36.4 C)   TempSrc: Oral  Oral   SpO2: (!) 89% 90% 92% 96%  Weight:      Height:        Wt Readings from Last 3 Encounters:  08/23/18 87.6 kg  12/25/17 88.5 kg     Intake/Output Summary (Last 24 hours) at 08/24/2018 1257 Last data filed at 08/24/2018 1018 Gross per 24 hour  Intake 0 ml  Output -  Net 0 ml    Physical Exam:   GENERAL: Pleasant-appearing in no apparent distress.  HEAD, EYES, EARS, NOSE AND THROAT: Atraumatic, normocephalic. Extraocular muscles are intact. Pupils equal and reactive to light. Sclerae anicteric. No conjunctival injection. No oro-pharyngeal erythema.  NECK: Supple. There is no jugular venous distention. No bruits, no lymphadenopathy, no thyromegaly.  HEART: Regular rate and rhythm,. No murmurs, no rubs, no clicks.  LUNGS: Decreased breath sounds without accessory muscle usage  aBDOMEN: Soft, flat, nontender, nondistended. Has good bowel sounds. No hepatosplenomegaly appreciated.  EXTREMITIES: No evidence of any cyanosis, clubbing, or peripheral edema.  +2 pedal and radial pulses bilaterally.  NEUROLOGIC: The patient is alert, awake, and  oriented x3 with no focal motor or sensory deficits appreciated bilaterally.  SKIN: Moist and warm with no rashes appreciated.  Psych: Not anxious, depressed LN: No inguinal LN enlargement    Antibiotics   Anti-infectives (From admission, onward)   Start     Dose/Rate Route Frequency Ordered Stop   08/23/18 1800  azithromycin (ZITHROMAX) tablet 250 mg     250 mg Oral Daily 08/23/18 1211 08/28/18 1759   08/22/18 1815  cefTRIAXone (ROCEPHIN) 1 g in sodium chloride 0.9 % 100 mL IVPB     1 g 200 mL/hr over 30 Minutes Intravenous Every 24  hours 08/22/18 1814 08/27/18 1814   08/22/18 1815  azithromycin (ZITHROMAX) 250 mg in dextrose 5 % 125 mL IVPB  Status:  Discontinued     250 mg 125 mL/hr over 60 Minutes Intravenous Every 24 hours 08/22/18 1814 08/23/18 1211      Medications   Scheduled Meds: . atenolol  50 mg Oral Daily  . atorvastatin  40 mg Oral Daily  . azithromycin  250 mg Oral Daily  . feeding supplement (ENSURE ENLIVE)  237 mL Oral BID BM  . heparin  5,000 Units Subcutaneous Q8H  . levothyroxine  100 mcg Oral Q0600  . multivitamin with minerals  1 tablet Oral Daily  . [START ON 08/25/2018] pneumococcal 13-valent conjugate vaccine  0.5 mL Intramuscular Tomorrow-1000  . spironolactone  25 mg Oral Daily   Continuous Infusions: . cefTRIAXone (ROCEPHIN)  IV Stopped (08/23/18 1800)   PRN Meds:.acetaminophen, docusate sodium, guaiFENesin-codeine, oxyCODONE-acetaminophen   Data Review:   Micro Results No results found for this or any previous visit (from the past 240 hour(s)).  Radiology Reports Dg Chest 2 View  Result Date: 08/22/2018 CLINICAL DATA:  Pneumonia. EXAM: CHEST - 2 VIEW COMPARISON:  None. FINDINGS: A right upper lobe pneumonia is identified. The heart, hila, mediastinum, lungs, and pleura are otherwise unremarkable. IMPRESSION: Right middle lobe infiltrate, likely pneumonia. Recommend follow-up to resolution. Electronically Signed   By: Dorise Bullion III  M.D   On: 08/22/2018 19:24     CBC Recent Labs  Lab 08/22/18 1732 08/23/18 0459 08/24/18 0614  WBC 31.2* 27.0* 21.6*  HGB 13.8 13.0 12.3  HCT 42.3 39.2 38.1  PLT 390 380 392  MCV 94.6 94.0 96.0  MCH 30.9 31.2 31.0  MCHC 32.6 33.2 32.3  RDW 13.4 13.2 13.3    Chemistries  Recent Labs  Lab 08/22/18 1732 08/23/18 0459  NA 138 139  K 3.5 3.5  CL 101 103  CO2 27 27  GLUCOSE 113* 97  BUN 45* 43*  CREATININE 1.46* 1.14*  CALCIUM 9.2 8.9  AST 41  --   ALT 23  --   ALKPHOS 212*  --   BILITOT 1.4*  --    ------------------------------------------------------------------------------------------------------------------ estimated creatinine clearance is 42.2 mL/min (A) (by C-G formula based on SCr of 1.14 mg/dL (H)). ------------------------------------------------------------------------------------------------------------------ No results for input(s): HGBA1C in the last 72 hours. ------------------------------------------------------------------------------------------------------------------ No results for input(s): CHOL, HDL, LDLCALC, TRIG, CHOLHDL, LDLDIRECT in the last 72 hours. ------------------------------------------------------------------------------------------------------------------ No results for input(s): TSH, T4TOTAL, T3FREE, THYROIDAB in the last 72 hours.  Invalid input(s): FREET3 ------------------------------------------------------------------------------------------------------------------ No results for input(s): VITAMINB12, FOLATE, FERRITIN, TIBC, IRON, RETICCTPCT in the last 72 hours.  Coagulation profile No results for input(s): INR, PROTIME in the last 168 hours.  No results for input(s): DDIMER in the last 72 hours.  Cardiac Enzymes No results for input(s): CKMB, TROPONINI, MYOGLOBIN in the last 168 hours.  Invalid input(s):  CK ------------------------------------------------------------------------------------------------------------------ Invalid input(s): [place]  Patient is a 83 year old admitted with pneumonia   *Community-acquired pneumonia Continue ceftriaxone and azithromycin Tylenol as needed for fever. Cough syrup for symptomatic relief.  *Acute encephalopathy due to pneumonia continue supportive care    *Hypertension Continue atenolol.  Heart pressure was low yesterday but today stable  *Hyperlipidemia Continue atorvastatin.  *Hypothyroidism Continue levothyroxine.  *Chronic kidney disease stage III This appears to be at baseline currently as compared to previous records. Continue to monitor.       Code Status Orders  (From admission, onward)  Start     Ordered   08/22/18 1824  Full code  Continuous     08/22/18 1823        Code Status History    This patient has a current code status but no historical code status.           Consults none  DVT Prophylaxis heparin  Lab Results  Component Value Date   PLT 392 08/24/2018     Time Spent in minutes 35 minutes greater than 50% of time spent in care coordination and counseling patient regarding the condition and plan of care.   Dustin Flock M.D on 08/24/2018 at 12:57 PM  Between 7am to 6pm - Pager - [phone]  After 6pm go to [email or web address] - Proofreader  [employer]   Office  [phone]

## 2018-08-24 NOTE — Progress Notes (Signed)
Patient very confused and agitated this shift. She has clarity intermittently but overall remains paranoid. Requires frequent reorientation. Will continue to monitor.

## 2018-08-24 NOTE — Progress Notes (Signed)
   08/24/18 1100  Clinical Encounter Type  Visited With Patient;Other (Comment) Idelle Crouch)  Visit Type Initial;Other (Comment) (OR for Prayer)  Referral From Nurse  Recommendations Follow-up, if requested.  Spiritual Encounters  Spiritual Needs Emotional   Chaplain responded to OR for prayer. The request was received at 1038.  Upon arrival, Chaplain discovered patient meeting with her priest. Patient complained that a request for prayer was made around 0700 and wanted to know where the Chaplain had been. Chaplain apologized that her request was delayed and turned pastoral care over to her priest. OR is closed.

## 2018-08-24 NOTE — Evaluation (Signed)
Physical Therapy Evaluation Patient Details Name: Jasmine Butler MRN: [id number] DOB: 02/28/1935 Today's Date: 08/24/2018   History of Present Illness  Jasmine Butler is n 83yo female who comes to  [facility] on 11/19 Butler malaise, productive cough, SOB, pt admitted with PNA. PMH: HLD, HypoTSH, pulmonary nodule. Pt has lived in apartment at [facility] x3 years.   Clinical Impression  Pt admitted with above diagnosis. Pt currently with functional limitations due to the deficits listed below (see "PT Problem List"). Upon entry, pt in bed, no family/caregiver present. The pt is awake, conditionally agreeable to participate briefly as she reports being exhausted and wishes to sleep. The pt is alert and oriented to self, largely pleasant, but minimally conversational, seems to require additional processing time, and is mostly non participatory with line of questioning, at one point saying 'you are very annoying, let's just get this over with.' AMB appears unsteady, pt typically AMB without AD, but recently adopting use of QC, which is not fluent nor stabilizing to gait. Additional gait training and AD training would benefit the pt, but for now, a RW may be more meaningful. Functional mobility assessment demonstrates increased effort/time requirements, poor tolerance, but no frank need for physical assistance, whereas the patient performed these at a higher level of independence PTA. There is difficulty establishing a true baseline level for this patient as she does not provide answers to these questions as asked.  Pt will benefit from skilled PT intervention to increase independence and safety with basic mobility in preparation for discharge to the venue listed below.       Follow Up Recommendations Home health PT;Supervision/Assistance - 24 hour;Supervision for mobility/OOB    Equipment Recommendations  Rolling walker with 5" wheels    Recommendations for Other Services       Precautions / Restrictions  Precautions Precautions: Fall Restrictions Weight Bearing Restrictions: No      Mobility  Bed Mobility Overal bed mobility: Modified Independent             General bed mobility comments: additional effort, but able to perform with physical assistance.   Transfers Overall transfer level: Needs assistance Equipment used: Quad cane Transfers: Sit to/from Stand Sit to Stand: Supervision         General transfer comment: additional effort, but able to perform with physical assistance.   Ambulation/Gait Ambulation/Gait assistance: Min guard;Supervision Gait Distance (Feet): 150 Feet Assistive device: Quad cane(QC too high in RUE, poor sequencing, relying mostly on railing LUE throuhgout. )   Gait velocity: 0.26m/s    General Gait Details: unsteady, drifting, but without frank loss of balance. Pt reliant on LUE railing on wall when able. Stop periodically without explanation, then requires VC to continue.   Stairs            Wheelchair Mobility    Modified Rankin (Stroke Patients Only)       Balance Overall balance assessment: Needs assistance         Standing balance support: During functional activity Standing balance-Leahy Scale: Good Standing balance comment: *see mobility section for detail                              Pertinent Vitals/Pain Pain Assessment: No/denies pain    Home Living Family/patient expects to be discharged to:: Private residence([facility] ) Living Arrangements: Alone   Type of Home: Apartment         Home Equipment: Kasandra Knudsen -  quad Additional Comments: Pt not participating in questioning as she expresses aggravation d/t fatigue.     Prior Function                 Hand Dominance        Extremity/Trunk Assessment   Upper Extremity Assessment Upper Extremity Assessment: Generalized weakness;Overall [facility] for tasks assessed    Lower Extremity Assessment Lower Extremity Assessment:  Generalized weakness;Overall WFL for tasks assessed       Communication      Cognition Arousal/Alertness: Awake/alert Behavior During Therapy: WFL for tasks assessed/performed Overall Cognitive Status: Within Functional Limits for tasks assessed                                 General Comments: ?HOH, difficulty understanding questions when asked multiple times and explained reason for PT       General Comments      Exercises     Assessment/Plan    PT Assessment Patient needs continued PT services  PT Problem List Decreased strength;Decreased activity tolerance;Decreased balance;Decreased mobility;Decreased coordination       PT Treatment Interventions Functional mobility training;Therapeutic activities;Therapeutic exercise;Patient/family education    PT Goals (Current goals can be found in the Care Plan section)  Acute Rehab PT Goals Patient Stated Goal: return to sleeping  PT Goal Formulation: With patient Time For Goal Achievement: 09/07/18 Potential to Achieve Goals: Good    Frequency Min 2X/week   Barriers to discharge Decreased caregiver support      Co-evaluation               AM-PAC PT "6 Clicks" Daily Activity  Outcome Measure Difficulty turning over in bed (including adjusting bedclothes, sheets and blankets)?: A Lot Difficulty moving from lying on back to sitting on the side of the bed? : A Lot Difficulty sitting down on and standing up from a chair with arms (e.g., wheelchair, bedside commode, etc,.)?: Unable Help needed moving to and from a bed to chair (including a wheelchair)?: None Help needed walking in hospital room?: A Little Help needed climbing 3-5 steps with a railing? : A Lot 6 Click Score: 14    End of Session Equipment Utilized During Treatment: Gait belt Activity Tolerance: Patient limited by fatigue;Other (comment)(exhausted, and wishes to sleep, annoyed by PT per pt report) Patient left: in bed;with call  bell/phone within reach Nurse Communication: (Pt requests chaplain visit) PT Visit Diagnosis: Unsteadiness on feet (R26.81);Muscle weakness (generalized) (M62.81);Difficulty in walking, not elsewhere classified (R26.2)    Time: 1638-4665 PT Time Calculation (min) (ACUTE ONLY): 14 min   Charges:   PT Evaluation $PT Eval Low Complexity: 1 Low          11:19 AM, 08/24/18 Etta Grandchild, PT, DPT Physical Therapist - [facility]  [phone] ([place])     Jasmine Butler 08/24/2018, 11:15 AM

## 2018-08-25 LAB — CBC
HEMATOCRIT: 40.8 % (ref 36.0–46.0)
Hemoglobin: 13.2 g/dL (ref 12.0–15.0)
MCH: 30.8 pg (ref 26.0–34.0)
MCHC: 32.4 g/dL (ref 30.0–36.0)
MCV: 95.1 fL (ref 80.0–100.0)
NRBC: 0 % (ref 0.0–0.2)
Platelets: 422 10*3/uL — ABNORMAL HIGH (ref 150–400)
RBC: 4.29 MIL/uL (ref 3.87–5.11)
RDW: 13.4 % (ref 11.5–15.5)
WBC: 19.5 10*3/uL — AB (ref 4.0–10.5)

## 2018-08-25 LAB — BASIC METABOLIC PANEL
ANION GAP: 7 (ref 5–15)
BUN: 27 mg/dL — ABNORMAL HIGH (ref 8–23)
CO2: 27 mmol/L (ref 22–32)
Calcium: 8.9 mg/dL (ref 8.9–10.3)
Chloride: 107 mmol/L (ref 98–111)
Creatinine, Ser: 0.97 mg/dL (ref 0.44–1.00)
GFR, EST NON AFRICAN AMERICAN: 54 mL/min — AB (ref >60)
GLUCOSE: 95 mg/dL (ref 70–99)
POTASSIUM: 3.9 mmol/L (ref 3.5–5.1)
Sodium: 141 mmol/L (ref 135–145)

## 2018-08-25 MED ORDER — CEFUROXIME AXETIL 500 MG PO TABS: 500.0000 mg | ORAL_TABLET | Freq: Two times a day (BID) | ORAL | 0 refills | Status: AC

## 2018-08-25 MED ORDER — AZITHROMYCIN 250 MG PO TABS: 250.0000 mg | ORAL_TABLET | Freq: Every day | ORAL | 0 refills | Status: AC

## 2018-08-25 MED ORDER — GUAIFENESIN-DM 100-10 MG/5ML PO SYRP: 15.0000 mL | ORAL_SOLUTION | ORAL | 0 refills | Status: DC | PRN

## 2018-08-25 MED ORDER — GUAIFENESIN ER 600 MG PO TB12
600.0000 mg | ORAL_TABLET | Freq: Two times a day (BID) | ORAL | Status: DC
  Administered 2018-08-25: 600 mg via ORAL
  Filled 2018-08-25: qty 1

## 2018-08-25 NOTE — Progress Notes (Signed)
Patient discharged home per MD order. All discharge instructions given and all questions answered. [facility] will provide transportation.

## 2018-08-25 NOTE — Care Management Important Message (Signed)
Copy of signed IM left with patient in room.  

## 2018-08-25 NOTE — Care Management (Signed)
Patient is to discharge back to her independent living apartment at the [facility].  [facility] will transport.  Patient is agreeable to home health.  She says  the therapist can come to her or she can go to them. Left voicemail for the independent living nurse but no return call.   Per Cherlynn June if patient needs to be seen in the home, then home health needs to be ordered then patient can transition to community's outpatient rehab.  No agency preference.  Called referral to [facility] for PT and Aide.

## 2018-09-01 ENCOUNTER — Telehealth: Payer: Self-pay

## 2018-09-01 NOTE — Telephone Encounter (Signed)
Flagged on EMMI report for not knowing who to call about changes in condition.  Called and spoke with patient.  She is aware to call her PCP for any needs.  Unable to speak for long due to someone on other line.  No questions or concerns noted at time of call.

## 2018-09-04 NOTE — Discharge Summary (Signed)
[name] at [facility], 83 y.o., DOB 1935/08/20, MRN [id number]. Admission date: 08/22/2018 Discharge Date 09/04/2018 Primary MD Baxter Hire, MD Admitting Physician Vaughan Basta, MD  Admission Diagnosis  Pna Sepsis  Discharge Diagnosis   Principal Problem:   Community acquired pneumonia   Acute encephalopathy Essential hypertension Hyperlipidemia Hypothyroidism Chronic kidney disease stage III    Hospital Course  Patient is 83 year old sent from primary care provider's office due to generalized weakness shortness of breath leukocytosis was noted to have pneumonia.  Patient was admitted to the hospital and treated with antibiotics.  Her white blood cell count started improving.  She is doing much better and is stable for discharge.            Consults  None  Significant Tests:  See full reports for all details     Dg Chest 2 View  Result Date: 08/22/2018 CLINICAL DATA:  Pneumonia. EXAM: CHEST - 2 VIEW COMPARISON:  None. FINDINGS: A right upper lobe pneumonia is identified. The heart, hila, mediastinum, lungs, and pleura are otherwise unremarkable. IMPRESSION: Right middle lobe infiltrate, likely pneumonia. Recommend follow-up to resolution. Electronically Signed   By: Dorise Bullion III M.D   On: 08/22/2018 19:24       Today   Subjective:   Jasmine Butler patient denies any symptoms  Objective:   Blood pressure (!) 113/49, pulse 78, temperature (!) 97.3 F (36.3 C), temperature source Axillary, resp. rate 20, height 5\' 4"  (1.626 m), weight 87.6 kg, SpO2 94 %.  . No intake or output data in the 24 hours ending 09/04/18 1021  Exam VITAL SIGNS: Blood pressure (!) 113/49, pulse 78, temperature (!) 97.3 F (36.3 C), temperature source Axillary, resp. rate 20, height 5\' 4"  (1.626 m), weight 87.6 kg, SpO2 94 %.  GENERAL:  83 y.o.-year-old patient lying in the bed with no acute distress.  EYES: Pupils equal, round,  reactive to light and accommodation. No scleral icterus. Extraocular muscles intact.  HEENT: Head atraumatic, normocephalic. Oropharynx and nasopharynx clear.  NECK:  Supple, no jugular venous distention. No thyroid enlargement, no tenderness.  LUNGS: Normal breath sounds bilaterally, no wheezing, rales,rhonchi or crepitation. No use of accessory muscles of respiration.  CARDIOVASCULAR: S1, S2 normal. No murmurs, rubs, or gallops.  ABDOMEN: Soft, nontender, nondistended. Bowel sounds present. No organomegaly or mass.  EXTREMITIES: No pedal edema, cyanosis, or clubbing.  NEUROLOGIC: Cranial nerves II through XII are intact. Muscle strength 5/5 in all extremities. Sensation intact. Gait not checked.  PSYCHIATRIC: The patient is alert and oriented x 3.  SKIN: No obvious rash, lesion, or ulcer.   Data Review     CBC w Diff:  Lab Results  Component Value Date   WBC 19.5 (H) 08/25/2018   HGB 13.2 08/25/2018   HCT 40.8 08/25/2018   PLT 422 (H) 08/25/2018   LYMPHOPCT 13 12/25/2017   MONOPCT 8 12/25/2017   EOSPCT 1 12/25/2017   BASOPCT 0 12/25/2017   CMP:  Lab Results  Component Value Date   NA 141 08/25/2018   K 3.9 08/25/2018   CL 107 08/25/2018   CO2 27 08/25/2018   BUN 27 (H) 08/25/2018   CREATININE 0.97 08/25/2018   PROT 7.1 08/22/2018   ALBUMIN 2.6 (L) 08/22/2018   BILITOT 1.4 (H) 08/22/2018   ALKPHOS 212 (H) 08/22/2018   AST 41 08/22/2018   ALT 23 08/22/2018  .  Micro Results No results found for this or any previous visit (  from the past 240 hour(s)).   Code Status History    Date Active Date Inactive Code Status Order ID Comments User Context   08/22/2018 1824 08/25/2018 1813 Full Code [id number]  Vaughan Basta, MD Inpatient          Follow-up Information    Mortimer Fries, PA-C. Go on 08/30/2018.   Specialty:  Physician Assistant Why:  @9 :00 AM Contact information: [place]  [id number] [phone]        [facility] Follow up.   Specialty:  Home Health Services Why:  Agency will call you to set up initial visit Contact information: [address] [place] [address] [phone]           Discharge Medications   Allergies as of 08/25/2018      Reactions   Other    Chicken/poultry   Shellfish Allergy       Medication List    TAKE these medications   atenolol 50 MG tablet Commonly known as:  TENORMIN Take 50 mg by mouth daily.   atorvastatin 40 MG tablet Commonly known as:  LIPITOR Take 40 mg by mouth daily.   guaiFENesin-dextromethorphan 100-10 MG/5ML syrup Commonly known as:  ROBITUSSIN DM Take 15 mLs by mouth every 4 (four) hours as needed for cough.   oxybutynin 5 MG 24 hr tablet Commonly known as:  DITROPAN-XL Take 5 mg by mouth daily.   oxyCODONE-acetaminophen 5-325 MG tablet Commonly known as:  PERCOCET/ROXICET Take 1 tablet by mouth every 4 (four) hours as needed for severe pain.   spironolactone 25 MG tablet Commonly known as:  ALDACTONE Take 25 mg by mouth daily.   SYNTHROID 100 MCG tablet Generic drug:  levothyroxine Take 1 tablet by mouth daily.     ASK your doctor about these medications   azithromycin 250 MG tablet Commonly known as:  ZITHROMAX Take 1 tablet (250 mg total) by mouth daily for 3 days. Ask about: Should I take this medication?   cefUROXime 500 MG tablet Commonly known as:  CEFTIN Take 1 tablet (500 mg total) by mouth 2 (two) times daily for 5 days. Ask about: Should I take this medication?          Total Time in preparing paper work, data evaluation and todays exam - 61 minutes  Dustin Flock M.D on 09/04/2018 at 10:21 AM [place]  [phone]

## 2018-09-06 ENCOUNTER — Ambulatory Visit
Admission: RE | Admit: 2018-09-06 | Discharge: 2018-09-06 | Disposition: A | Discharge status: Home / Self Care | Payer: Medicare Other | Source: Ambulatory Visit | Attending: Internal Medicine | Admitting: Internal Medicine

## 2018-09-06 DIAGNOSIS — Z1231 Encounter for screening mammogram for malignant neoplasm of breast: Secondary | ICD-10-CM

## 2019-01-22 ENCOUNTER — Other Ambulatory Visit: Payer: Self-pay | Admitting: Internal Medicine

## 2019-01-22 DIAGNOSIS — N6311 Unspecified lump in the right breast, upper outer quadrant: Secondary | ICD-10-CM

## 2019-02-05 ENCOUNTER — Other Ambulatory Visit: Payer: Self-pay | Admitting: Internal Medicine

## 2019-02-05 DIAGNOSIS — N6320 Unspecified lump in the left breast, unspecified quadrant: Secondary | ICD-10-CM

## 2019-03-27 IMAGING — MG 2D DIGITAL SCREENING BILATERAL MAMMOGRAM WITH CAD AND ADJUNCT TO
8 of 11 series · 8 of 27 positions shown · non-contrast
Comparison: Previous exam(s).

CLINICAL DATA: Screening.

EXAM:
2D DIGITAL SCREENING BILATERAL MAMMOGRAM WITH CAD AND ADJUNCT TOMO

[R MLO]
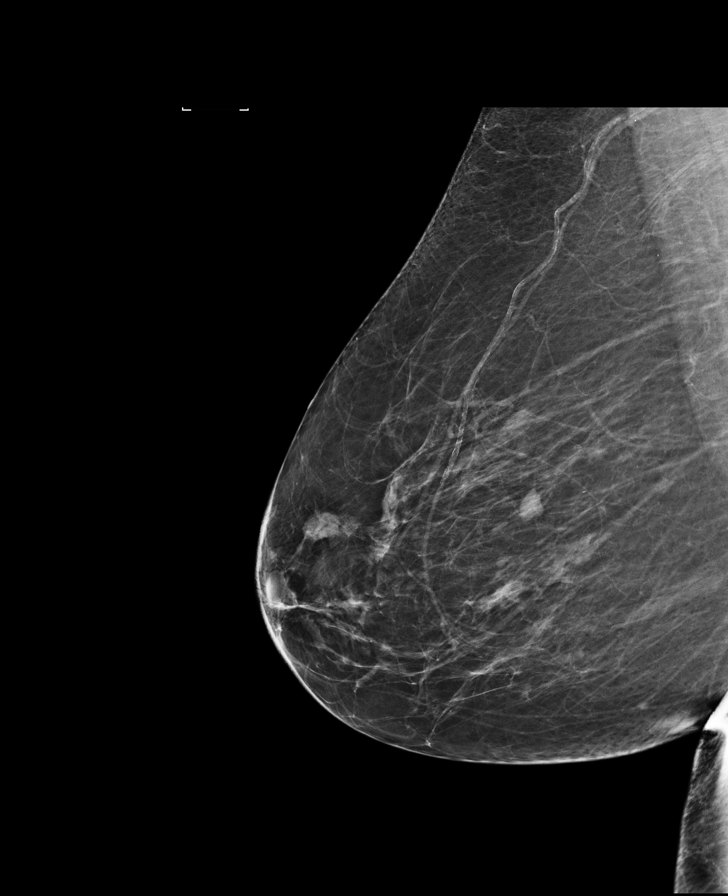

[R CC]
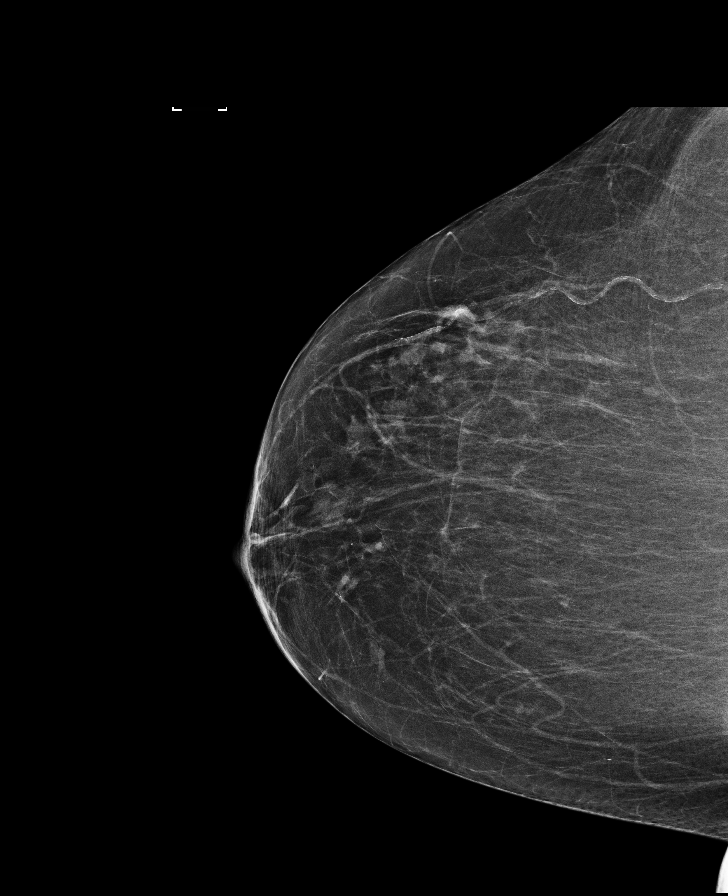

[L MLO synth-2D]
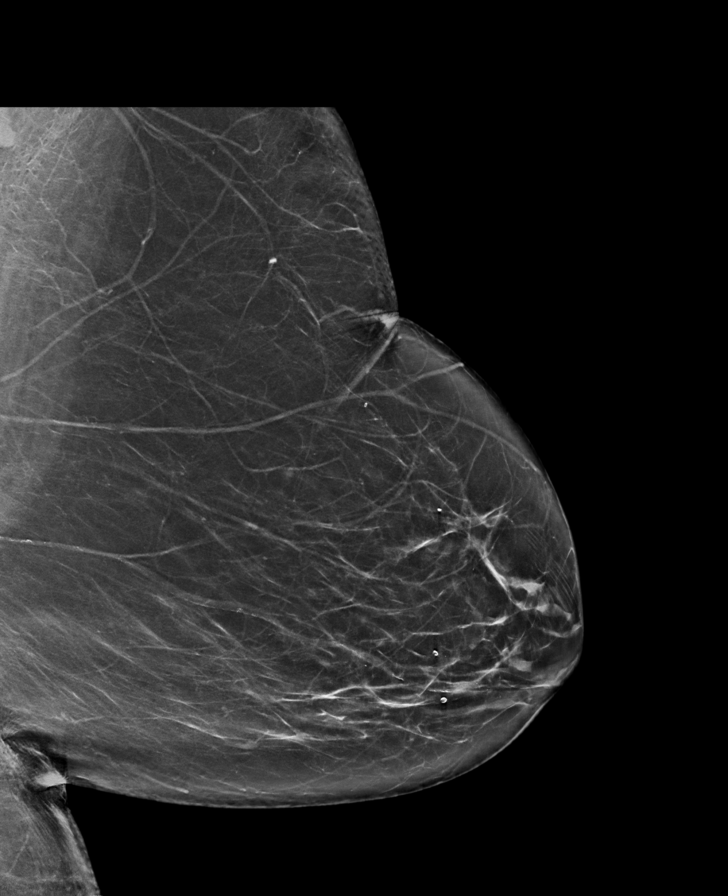

[R MLO synth-2D]
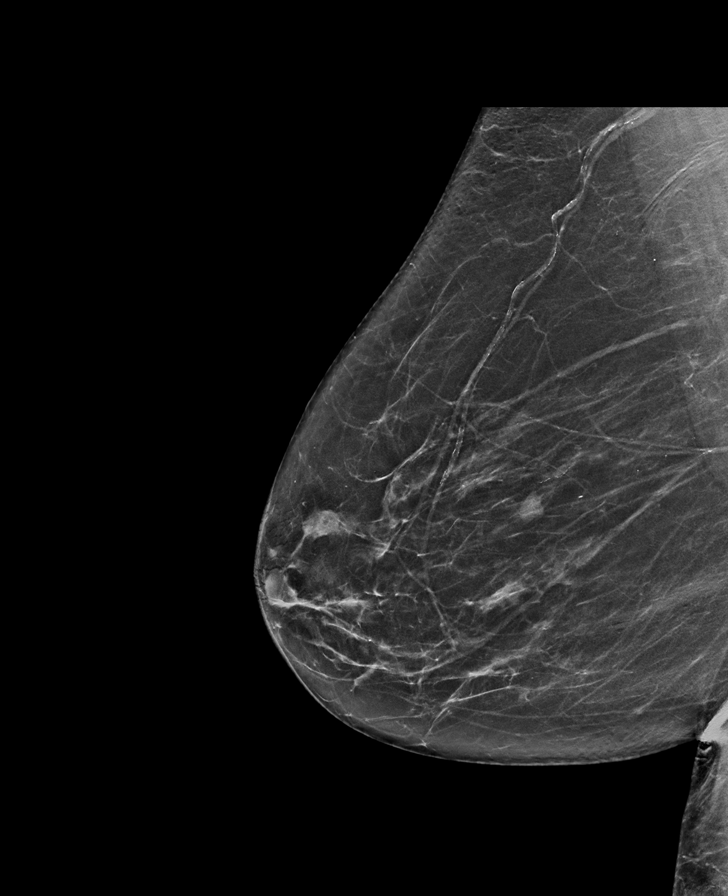

[R CC synth-2D]
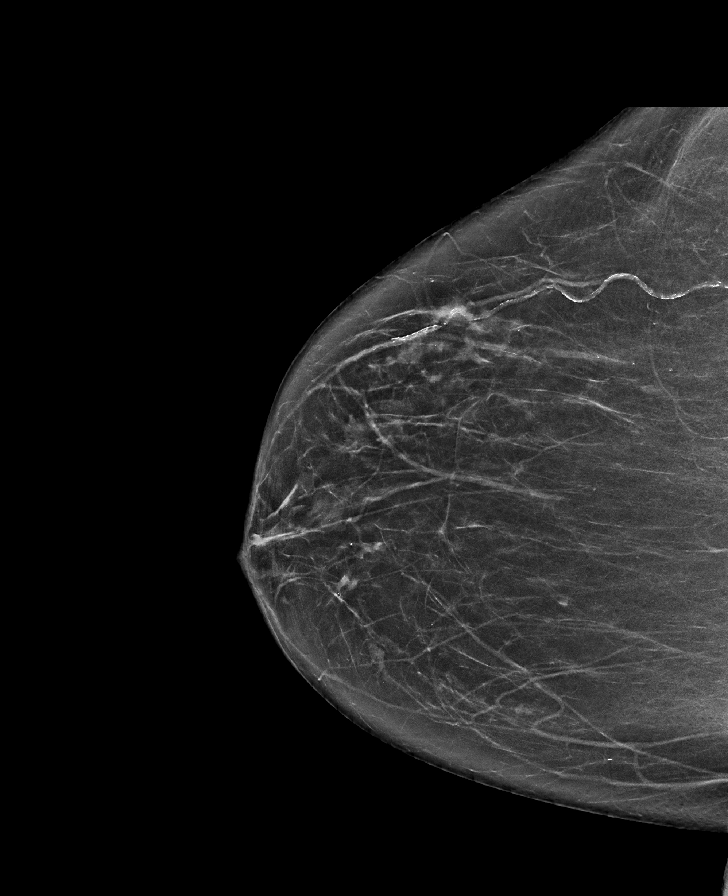

[L CC synth-2D]
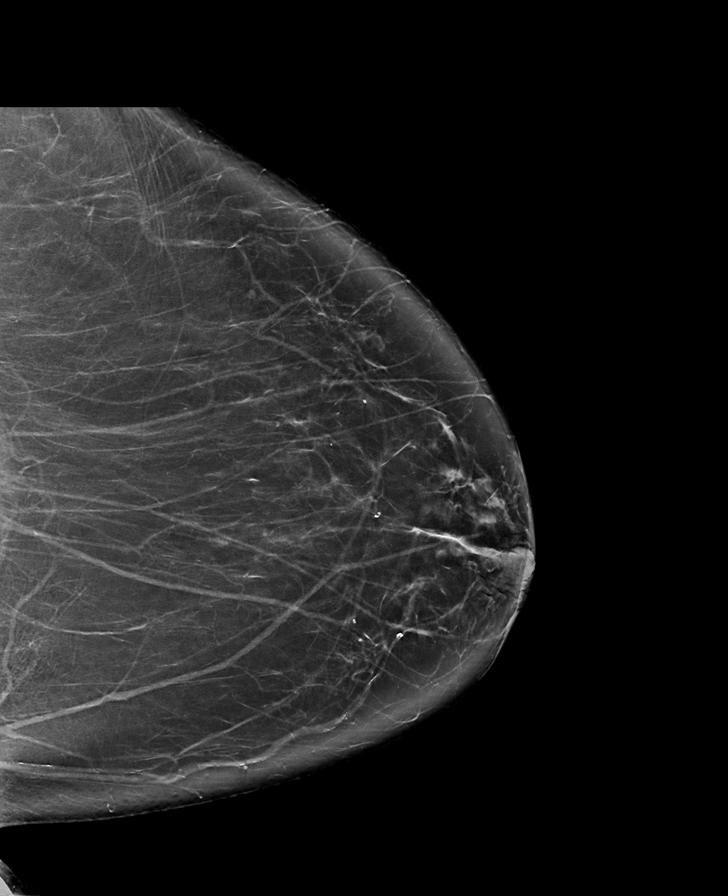

[L MLO]
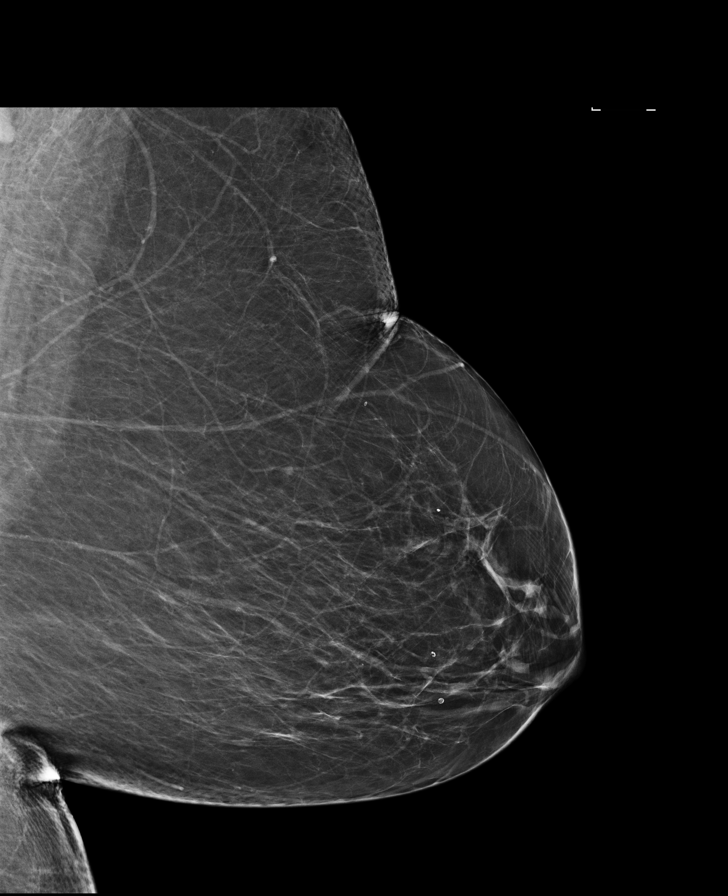

[R CC tomo · tomo slice 34/67.0]
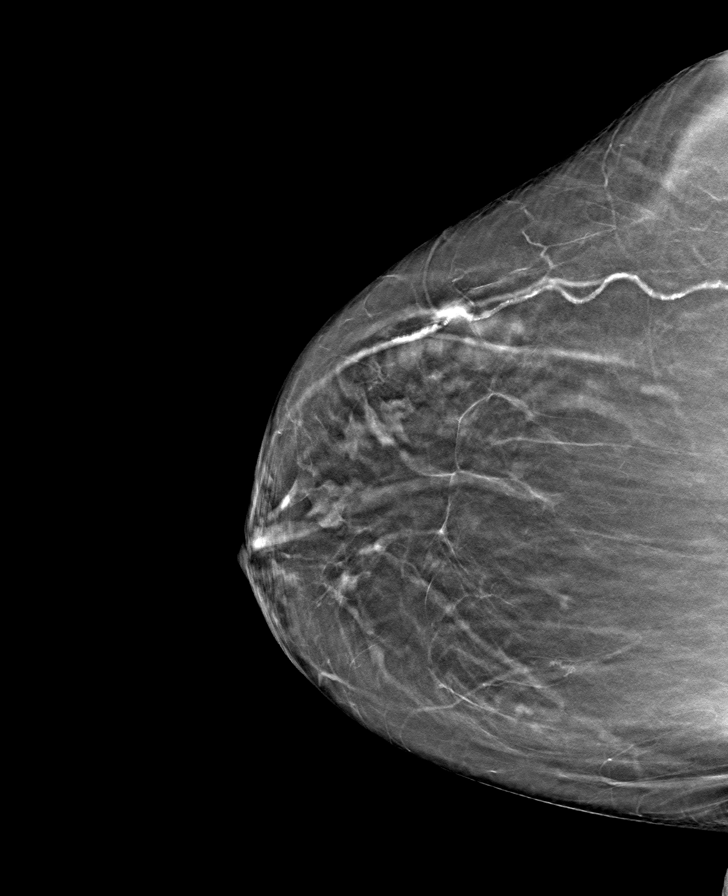

[8 of 27 positions shown; findings below may reference images not displayed]

ACR Breast Density Category b: There are scattered areas of
fibroglandular density.
FINDINGS: In the right breast, a possible asymmetry warrants further
evaluation. In the left breast, no findings suspicious for
malignancy. Images were processed with CAD.
IMPRESSION: Further evaluation is suggested for possible asymmetry in the right
breast.

RECOMMENDATION:
Diagnostic mammogram and possibly ultrasound of the right breast.
(Code:EB-[id number])

The patient will be contacted regarding the findings, and additional
imaging will be scheduled.

BI-RADS CATEGORY  0: Incomplete. Need additional imaging evaluation
and/or prior mammograms for comparison.

## 2019-04-12 ENCOUNTER — Other Ambulatory Visit: Payer: Self-pay | Admitting: Internal Medicine

## 2019-04-12 DIAGNOSIS — N6311 Unspecified lump in the right breast, upper outer quadrant: Secondary | ICD-10-CM

## 2019-06-14 ENCOUNTER — Other Ambulatory Visit: Payer: Self-pay | Admitting: Internal Medicine

## 2019-06-14 DIAGNOSIS — Z1231 Encounter for screening mammogram for malignant neoplasm of breast: Secondary | ICD-10-CM

## 2019-07-23 ENCOUNTER — Other Ambulatory Visit: Payer: Self-pay

## 2019-07-23 ENCOUNTER — Encounter: Payer: Self-pay | Admitting: Neurology

## 2019-07-23 ENCOUNTER — Ambulatory Visit (INDEPENDENT_AMBULATORY_CARE_PROVIDER_SITE_OTHER): Payer: Medicare Other | Admitting: Neurology

## 2019-07-23 VITALS — BP 112/62 | HR 74 | Temp 97.8°F | Ht 64.0 in | Wt 196.3 lb

## 2019-07-23 DIAGNOSIS — E538 Deficiency of other specified B group vitamins: Secondary | ICD-10-CM

## 2019-07-23 DIAGNOSIS — R269 Unspecified abnormalities of gait and mobility: Secondary | ICD-10-CM
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
## 2019-07-25 ENCOUNTER — Telehealth: Payer: Self-pay | Admitting: Neurology

## 2019-07-25 DIAGNOSIS — R269 Unspecified abnormalities of gait and mobility: Secondary | ICD-10-CM

## 2019-07-25 NOTE — Telephone Encounter (Signed)
Pt is needing to speak to the RN about her medications. Please advise.

## 2019-07-25 NOTE — Telephone Encounter (Signed)
I reached out to the pt. She states on on 07/23/2019 she forgot to mention she had been taking for pantoprazole 40mg  for 15-16 months and most recently d/c 07/09/2019. Pt states she was reading the drug information packet for this medication and sates this medication could be the cause of her (muscle weakness, dizziness and confusion) she reports the information she was reading had these sx are possible side effects.   Pt also states she would like to proceed with Physical Therapy that was mentioned during 07/23/2019 visit but would like for the therapy to take place at [facility] [address] [place], [place] [address]  [phone]

## 2019-07-25 NOTE — Telephone Encounter (Signed)
I will get physical therapy set up.

## 2019-07-26 LAB — MULTIPLE MYELOMA PANEL, SERUM
Albumin SerPl Elph-Mcnc: 3.4 g/dL (ref 2.9–4.4)
Albumin/Glob SerPl: 1 (ref 0.7–1.7)
Alpha 1: 0.2 g/dL (ref 0.0–0.4)
Alpha2 Glob SerPl Elph-Mcnc: 0.9 g/dL (ref 0.4–1.0)
B-Globulin SerPl Elph-Mcnc: 1.5 g/dL — ABNORMAL HIGH (ref 0.7–1.3)
Gamma Glob SerPl Elph-Mcnc: 1 g/dL (ref 0.4–1.8)
Globulin, Total: 3.6 g/dL (ref 2.2–3.9)
IgA/Immunoglobulin A, Serum: 944 mg/dL — ABNORMAL HIGH (ref 64–422)
IgG (Immunoglobin G), Serum: 1201 mg/dL (ref 586–1602)
IgM (Immunoglobulin M), Srm: 72 mg/dL (ref 26–217)
Total Protein: 7 g/dL (ref 6.0–8.5)

## 2019-07-26 LAB — ANGIOTENSIN CONVERTING ENZYME: Angio Convert Enzyme: 52 U/L (ref 14–82)

## 2019-07-26 LAB — SEDIMENTATION RATE: Sed Rate: 9 mm/hr (ref 0–40)

## 2019-07-26 LAB — VITAMIN B12: Vitamin B-12: 387 pg/mL (ref 232–1245)

## 2019-07-26 LAB — COPPER, SERUM: Copper: 102 ug/dL (ref 72–166)

## 2019-07-26 LAB — ANA W/REFLEX: Anti Nuclear Antibody (ANA): NEGATIVE

## 2019-07-26 LAB — RPR: RPR Ser Ql: NONREACTIVE

## 2019-07-26 LAB — B. BURGDORFI ANTIBODIES: Lyme IgG/IgM Ab: <0.91 {ISR} (ref 0.00–0.90)

## 2019-07-30 ENCOUNTER — Telehealth: Payer: Self-pay | Admitting: Neurology

## 2019-07-30 NOTE — Telephone Encounter (Signed)
Referral has been faxed to the number below. DW

## 2019-07-30 NOTE — Telephone Encounter (Signed)
Fax to [facility] Referral sheet and Dr. Jannifer Franklin last note . Fax cover sheet .  Fax number [phone]. Thank you .

## 2019-07-31 ENCOUNTER — Telehealth: Payer: Self-pay | Admitting: Neurology

## 2019-07-31 ENCOUNTER — Telehealth: Payer: Self-pay

## 2019-07-31 NOTE — Telephone Encounter (Signed)
Pt notified of results and verbalized understanding. Pt's EMG/NCS has been rescheduled to 08/01/2019.

## 2019-07-31 NOTE — Telephone Encounter (Signed)
-----   Message from Kathrynn Ducking, MD sent at 07/26/2019  5:01 PM EDT -----  The blood work results are unremarkable. Please call the patient. ----- Message ----- From: Lavone Neri Lab Results In Sent: 07/24/2019   7:37 AM EDT To: Kathrynn Ducking, MD

## 2019-07-31 NOTE — Telephone Encounter (Signed)
I contacted the pt back see result telephone note from 07/31/2019.

## 2019-07-31 NOTE — Telephone Encounter (Signed)
The patient has had blood work done on 26 July 2019.  White blood count was 9.1, hemoglobin of 13.4, hematocrit 41.9, MCV of 99.1, platelets of 264.

## 2019-07-31 NOTE — Telephone Encounter (Signed)
Pt called stating that she had a missed call from the office. Pt states it takes her a bit to get to the phone but will be near it for the call back. Please advise.

## 2019-07-31 NOTE — Telephone Encounter (Signed)
Called try fax [phone] . Thanks [employer]

## 2019-08-01 ENCOUNTER — Other Ambulatory Visit: Payer: Self-pay

## 2019-08-01 ENCOUNTER — Encounter: Payer: Self-pay | Admitting: Neurology

## 2019-08-01 ENCOUNTER — Ambulatory Visit (INDEPENDENT_AMBULATORY_CARE_PROVIDER_SITE_OTHER): Payer: Medicare Other | Admitting: Neurology

## 2019-08-01 DIAGNOSIS — G603 Idiopathic progressive neuropathy: Secondary | ICD-10-CM | POA: Diagnosis not present

## 2019-08-01 DIAGNOSIS — R269 Unspecified abnormalities of gait and mobility: Secondary | ICD-10-CM

## 2019-08-01 DIAGNOSIS — G629 Polyneuropathy, unspecified: Secondary | ICD-10-CM | POA: Insufficient documentation

## 2019-08-01 HISTORY — DX: Polyneuropathy, unspecified: G62.9

## 2019-08-01 NOTE — Progress Notes (Addendum)
The patient comes in for EMG and nerve conduction study evaluation.  Nerve conductions do show evidence of a peripheral neuropathy, EMG of the right leg was relatively unremarkable, no evidence of a radiculopathy.  We may do physical therapy in the future for gait training.  Blood work was unremarkable.         [place] [facility]    Nerve / Sites Muscle Latency Ref. Amplitude Ref. Rel Amp Segments Distance Velocity Ref. Area    ms ms mV mV %  cm m/s m/s mVms  R Peroneal - EDB     Ankle EDB 4.1 ?6.5 2.4 ?2.0 100 Ankle - EDB 9   9.4     Fib head EDB 10.0  1.5  62.7 Fib head - Ankle 28 48 ?44 5.5     Pop fossa EDB 12.3  1.3  84.8 Pop fossa - Fib head 10 44 ?44 4.8         Pop fossa - Ankle      L Peroneal - EDB     Ankle EDB 5.1 ?6.5 2.3 ?2.0 100 Ankle - EDB 9   9.2     Fib head EDB 11.5  2.1  89.2 Fib head - Ankle 28 44 ?44 8.1     Pop fossa EDB NR  NR  NR Pop fossa - Fib head 10 NR ?44 NR         Pop fossa - Ankle      R Tibial - AH     Ankle AH 3.0 ?5.8 4.0 ?4.0 100 Ankle - AH 9   17.3     Pop fossa AH 12.9  1.0  25.9 Pop fossa - Ankle 37 38 ?41 5.7  L Tibial - AH     Ankle AH 4.0 ?5.8 4.8 ?4.0 100 Ankle - AH 9   7.7     Pop fossa AH 13.7  1.2  25.7 Pop fossa - Ankle 37 38 ?41 5.1             SNC    Nerve / Sites Rec. Site Peak Lat Ref.  Amp Ref. Segments Distance    ms ms V V  cm  R Sural - Ankle (Calf)     Calf Ankle NR ?4.4 NR ?6 Calf - Ankle 14  L Sural - Ankle (Calf)     Calf Ankle NR ?4.4 NR ?6 Calf - Ankle 14  R Superficial peroneal - Ankle     Lat leg Ankle NR ?4.4 NR ?6 Lat leg - Ankle 14  L Superficial peroneal - Ankle     Lat leg Ankle NR ?4.4 NR ?6 Lat leg - Ankle 14              F  Wave    Nerve F Lat Ref.   ms ms  R Tibial - AH 54.2 ?56.0  L Tibial - AH 54.4 ?56.0

## 2019-08-01 NOTE — Progress Notes (Signed)
Please refer to EMG and nerve conduction procedure note.  

## 2019-08-01 NOTE — Procedures (Signed)
     HISTORY:  Jasmine Butler is an 84 year old patient with a history of gait instability with a sensation of imbalance.  The patient has been noted to have decreased position and vibration sensation in both feet and is being evaluated for possible peripheral neuropathy.  NERVE CONDUCTION STUDIES:  Nerve conduction studies were performed on both lower extremities.  The distal motor latencies for the peroneal and posterior tibial nerves were within normal limits bilaterally with normal motor amplitudes for these nerves bilaterally.  The right peroneal nerve revealed normal nerve conduction velocities but no response was seen above the knee on the left peroneal nerve and slowing was seen for the posterior tibial nerves bilaterally.  No response was seen for the sural and peroneal sensory latencies bilaterally.  The F-wave latencies for the posterior tibial nerves were normal bilaterally.  EMG STUDIES:  EMG study was performed on the right lower extremity:  The tibialis anterior muscle reveals 2 to 4K motor units with full recruitment. No fibrillations or positive waves were seen. The peroneus tertius muscle reveals 2 to 4K motor units with full recruitment. No fibrillations or positive waves were seen. The medial gastrocnemius muscle reveals 1 to 4K motor units with slightly decreased recruitment. No fibrillations or positive waves were seen. The vastus lateralis muscle reveals 2 to 4K motor units with full recruitment. No fibrillations or positive waves were seen. The iliopsoas muscle reveals 2 to 4K motor units with full recruitment. No fibrillations or positive waves were seen. The biceps femoris muscle (long head) reveals 2 to 4K motor units with full recruitment. No fibrillations or positive waves were seen. The lumbosacral paraspinal muscles were tested at 3 levels, and revealed no abnormalities of insertional activity at all 3 levels tested. There was good relaxation.   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  EMG evaluation of the right lower extremity shows mild isolated neuropathic denervation in the medial gastrocnemius muscle but otherwise no significant changes were seen, no evidence of an overlying lumbosacral radiculopathy could be confirmed on this study.  Jill Alexanders MD 08/01/2019 3:27 PM  [facility] [address] [place], [place] [address]  Phone [phone] Fax [phone]

## 2019-08-01 NOTE — Telephone Encounter (Signed)
Fax needs to be re sent it was cut off [phone]

## 2019-08-02 NOTE — Telephone Encounter (Signed)
I have refaxed. DW

## 2019-08-06 ENCOUNTER — Telehealth: Payer: Self-pay | Admitting: Neurology

## 2019-08-06 DIAGNOSIS — I6523 Occlusion and stenosis of bilateral carotid arteries: Secondary | ICD-10-CM | POA: Insufficient documentation

## 2019-08-06 NOTE — Telephone Encounter (Signed)
Physical Therapy orders have been faxed to the number listed. Confirmation received.

## 2019-08-06 NOTE — Telephone Encounter (Signed)
[place] Director of [place] called stating that a order for the pt's PT needs to be faxed to [phone]

## 2019-08-28 ENCOUNTER — Encounter: Payer: Medicare Other | Admitting: Neurology

## 2019-09-03 ENCOUNTER — Other Ambulatory Visit: Payer: Self-pay | Admitting: Family Medicine

## 2019-09-03 ENCOUNTER — Other Ambulatory Visit: Payer: Self-pay

## 2019-09-03 ENCOUNTER — Ambulatory Visit
Admission: RE | Admit: 2019-09-03 | Discharge: 2019-09-03 | Disposition: A | Discharge status: Home / Self Care | Payer: Medicare Other | Source: Ambulatory Visit | Attending: Family Medicine | Admitting: Family Medicine

## 2019-09-03 DIAGNOSIS — R10813 Right lower quadrant abdominal tenderness: Secondary | ICD-10-CM | POA: Insufficient documentation

## 2019-09-03 MED ORDER — IOHEXOL 300 MG/ML  SOLN
100.0000 mL | Freq: Once | INTRAMUSCULAR | Status: AC | PRN
  Administered 2019-09-03: 100 mL via INTRAVENOUS

## 2019-09-10 ENCOUNTER — Ambulatory Visit
Admission: RE | Admit: 2019-09-10 | Discharge: 2019-09-10 | Disposition: A | Discharge status: Home / Self Care | Payer: Medicare Other | Source: Ambulatory Visit | Attending: Internal Medicine | Admitting: Internal Medicine

## 2019-09-10 ENCOUNTER — Other Ambulatory Visit: Payer: Self-pay

## 2019-09-10 ENCOUNTER — Other Ambulatory Visit: Payer: Self-pay | Admitting: Internal Medicine

## 2019-09-10 DIAGNOSIS — N6311 Unspecified lump in the right breast, upper outer quadrant: Secondary | ICD-10-CM | POA: Insufficient documentation

## 2019-09-10 DIAGNOSIS — Z1231 Encounter for screening mammogram for malignant neoplasm of breast: Secondary | ICD-10-CM | POA: Diagnosis present

## 2020-12-12 ENCOUNTER — Encounter: Payer: Self-pay | Admitting: Emergency Medicine

## 2020-12-12 ENCOUNTER — Other Ambulatory Visit: Payer: Self-pay

## 2020-12-12 ENCOUNTER — Emergency Department: Payer: Medicare Other

## 2020-12-12 ENCOUNTER — Inpatient Hospital Stay
Admission: EM | Admit: 2020-12-12 | Discharge: 2020-12-17 | DRG: 392 | Disposition: A | Discharge status: Home Health | Payer: Medicare Other | Attending: Internal Medicine | Admitting: Internal Medicine

## 2020-12-12 DIAGNOSIS — R112 Nausea with vomiting, unspecified: Secondary | ICD-10-CM | POA: Diagnosis not present

## 2020-12-12 DIAGNOSIS — R443 Hallucinations, unspecified: Secondary | ICD-10-CM

## 2020-12-12 DIAGNOSIS — E039 Hypothyroidism, unspecified: Secondary | ICD-10-CM | POA: Diagnosis present

## 2020-12-12 DIAGNOSIS — R9431 Abnormal electrocardiogram [ECG] [EKG]: Secondary | ICD-10-CM | POA: Diagnosis present

## 2020-12-12 DIAGNOSIS — K529 Noninfective gastroenteritis and colitis, unspecified: Secondary | ICD-10-CM | POA: Diagnosis not present

## 2020-12-12 DIAGNOSIS — Z20822 Contact with and (suspected) exposure to covid-19: Secondary | ICD-10-CM | POA: Diagnosis present

## 2020-12-12 DIAGNOSIS — Z91018 Allergy to other foods: Secondary | ICD-10-CM

## 2020-12-12 DIAGNOSIS — I129 Hypertensive chronic kidney disease with stage 1 through stage 4 chronic kidney disease, or unspecified chronic kidney disease: Secondary | ICD-10-CM | POA: Diagnosis present

## 2020-12-12 DIAGNOSIS — E876 Hypokalemia: Secondary | ICD-10-CM | POA: Diagnosis present

## 2020-12-12 DIAGNOSIS — E860 Dehydration: Secondary | ICD-10-CM | POA: Diagnosis present

## 2020-12-12 DIAGNOSIS — N179 Acute kidney failure, unspecified: Secondary | ICD-10-CM | POA: Diagnosis present

## 2020-12-12 DIAGNOSIS — Z6833 Body mass index (BMI) 33.0-33.9, adult: Secondary | ICD-10-CM

## 2020-12-12 DIAGNOSIS — A048 Other specified bacterial intestinal infections: Secondary | ICD-10-CM

## 2020-12-12 DIAGNOSIS — Z91013 Allergy to seafood: Secondary | ICD-10-CM

## 2020-12-12 DIAGNOSIS — G629 Polyneuropathy, unspecified: Secondary | ICD-10-CM | POA: Diagnosis present

## 2020-12-12 DIAGNOSIS — B9681 Helicobacter pylori [H. pylori] as the cause of diseases classified elsewhere: Secondary | ICD-10-CM | POA: Diagnosis present

## 2020-12-12 DIAGNOSIS — Z79899 Other long term (current) drug therapy: Secondary | ICD-10-CM

## 2020-12-12 DIAGNOSIS — Z8701 Personal history of pneumonia (recurrent): Secondary | ICD-10-CM

## 2020-12-12 DIAGNOSIS — E785 Hyperlipidemia, unspecified: Secondary | ICD-10-CM | POA: Diagnosis present

## 2020-12-12 DIAGNOSIS — Z79890 Hormone replacement therapy (postmenopausal): Secondary | ICD-10-CM

## 2020-12-12 DIAGNOSIS — N189 Chronic kidney disease, unspecified: Secondary | ICD-10-CM

## 2020-12-12 DIAGNOSIS — W1839XA Other fall on same level, initial encounter: Secondary | ICD-10-CM | POA: Diagnosis present

## 2020-12-12 DIAGNOSIS — K2900 Acute gastritis without bleeding: Secondary | ICD-10-CM

## 2020-12-12 DIAGNOSIS — N1831 Chronic kidney disease, stage 3a: Secondary | ICD-10-CM | POA: Diagnosis present

## 2020-12-12 DIAGNOSIS — Z8249 Family history of ischemic heart disease and other diseases of the circulatory system: Secondary | ICD-10-CM

## 2020-12-12 DIAGNOSIS — Y92009 Unspecified place in unspecified non-institutional (private) residence as the place of occurrence of the external cause: Secondary | ICD-10-CM

## 2020-12-12 DIAGNOSIS — Z90710 Acquired absence of both cervix and uterus: Secondary | ICD-10-CM

## 2020-12-12 LAB — URINALYSIS, COMPLETE (UACMP) WITH MICROSCOPIC
Bilirubin Urine: NEGATIVE
Glucose, UA: NEGATIVE mg/dL
Hgb urine dipstick: NEGATIVE
Ketones, ur: NEGATIVE mg/dL
Nitrite: NEGATIVE
Protein, ur: NEGATIVE mg/dL
Specific Gravity, Urine: 1.005 (ref 1.005–1.030)
pH: 6 (ref 5.0–8.0)

## 2020-12-12 LAB — CBC
HCT: 40.5 % (ref 36.0–46.0)
Hemoglobin: 13.3 g/dL (ref 12.0–15.0)
MCH: 31.6 pg (ref 26.0–34.0)
MCHC: 32.8 g/dL (ref 30.0–36.0)
MCV: 96.2 fL (ref 80.0–100.0)
Platelets: 238 10*3/uL (ref 150–400)
RBC: 4.21 MIL/uL (ref 3.87–5.11)
RDW: 13.7 % (ref 11.5–15.5)
WBC: 8.7 10*3/uL (ref 4.0–10.5)
nRBC: 0 % (ref 0.0–0.2)

## 2020-12-12 LAB — COMPREHENSIVE METABOLIC PANEL
ALT: 28 U/L (ref 0–44)
AST: 41 U/L (ref 15–41)
Albumin: 3.5 g/dL (ref 3.5–5.0)
Alkaline Phosphatase: 97 U/L (ref 38–126)
Anion gap: 10 (ref 5–15)
BUN: 24 mg/dL — ABNORMAL HIGH (ref 8–23)
CO2: 24 mmol/L (ref 22–32)
Calcium: 10.3 mg/dL (ref 8.9–10.3)
Chloride: 103 mmol/L (ref 98–111)
Creatinine, Ser: 1.73 mg/dL — ABNORMAL HIGH (ref 0.44–1.00)
GFR, Estimated: 29 mL/min — ABNORMAL LOW (ref >60)
Glucose, Bld: 90 mg/dL (ref 70–99)
Potassium: 3.6 mmol/L (ref 3.5–5.1)
Sodium: 137 mmol/L (ref 135–145)
Total Bilirubin: 1.7 mg/dL — ABNORMAL HIGH (ref 0.3–1.2)
Total Protein: 7 g/dL (ref 6.5–8.1)

## 2020-12-12 LAB — LIPASE, BLOOD: Lipase: 38 U/L (ref 11–51)

## 2020-12-12 LAB — CK: Total CK: 131 U/L (ref 38–234)

## 2020-12-12 MED ORDER — ACETAMINOPHEN 650 MG RE SUPP: 325.0000 mg | Freq: Four times a day (QID) | RECTAL | Status: AC | PRN

## 2020-12-12 MED ORDER — ONDANSETRON HCL 4 MG/2ML IJ SOLN: 4.0000 mg | Freq: Four times a day (QID) | INTRAMUSCULAR | Status: DC | PRN

## 2020-12-12 MED ORDER — SUCRALFATE 1 G PO TABS
1.0000 g | ORAL_TABLET | Freq: Four times a day (QID) | ORAL | Status: DC
  Administered 2020-12-13 – 2020-12-14 (×3): 1 g via ORAL
  Filled 2020-12-12 (×5): qty 1

## 2020-12-12 MED ORDER — LEVOTHYROXINE SODIUM 100 MCG PO TABS
100.0000 ug | ORAL_TABLET | Freq: Every day | ORAL | Status: DC
  Administered 2020-12-13 – 2020-12-17 (×5): 100 ug via ORAL
  Filled 2020-12-12 (×6): qty 1

## 2020-12-12 MED ORDER — ACETAMINOPHEN 325 MG PO TABS: 325.0000 mg | ORAL_TABLET | Freq: Four times a day (QID) | ORAL | Status: AC | PRN

## 2020-12-12 MED ORDER — PANTOPRAZOLE SODIUM 40 MG PO TBEC
40.0000 mg | DELAYED_RELEASE_TABLET | Freq: Two times a day (BID) | ORAL | Status: DC
  Administered 2020-12-13 – 2020-12-17 (×9): 40 mg via ORAL
  Filled 2020-12-12 (×9): qty 1

## 2020-12-12 MED ORDER — ONDANSETRON HCL 4 MG PO TABS: 4.0000 mg | ORAL_TABLET | Freq: Four times a day (QID) | ORAL | Status: DC | PRN

## 2020-12-12 MED ORDER — CLARITHROMYCIN 500 MG PO TABS
250.0000 mg | ORAL_TABLET | Freq: Two times a day (BID) | ORAL | Status: DC
  Administered 2020-12-12 – 2020-12-17 (×10): 250 mg via ORAL
  Filled 2020-12-12 (×12): qty 0.5

## 2020-12-12 MED ORDER — ATORVASTATIN CALCIUM 20 MG PO TABS
40.0000 mg | ORAL_TABLET | Freq: Every day | ORAL | Status: DC
  Administered 2020-12-17: 40 mg via ORAL
  Filled 2020-12-12 (×5): qty 2

## 2020-12-12 MED ORDER — ENOXAPARIN SODIUM 30 MG/0.3ML ~~LOC~~ SOLN
30.0000 mg | SUBCUTANEOUS | Status: DC
  Administered 2020-12-13 – 2020-12-17 (×5): 30 mg via SUBCUTANEOUS
  Filled 2020-12-12 (×6): qty 0.3

## 2020-12-12 MED ORDER — SODIUM CHLORIDE 0.9 % IV SOLN: INTRAVENOUS | Status: AC

## 2020-12-12 MED ORDER — PANTOPRAZOLE SODIUM 40 MG IV SOLR
40.0000 mg | Freq: Once | INTRAVENOUS | Status: AC
  Administered 2020-12-12: 40 mg via INTRAVENOUS
  Filled 2020-12-12: qty 40

## 2020-12-12 MED ORDER — ATENOLOL 25 MG PO TABS
25.0000 mg | ORAL_TABLET | Freq: Every day | ORAL | Status: DC
  Administered 2020-12-14 – 2020-12-17 (×4): 25 mg via ORAL
  Filled 2020-12-12 (×5): qty 1

## 2020-12-12 MED ORDER — SPIRONOLACTONE 25 MG PO TABS
25.0000 mg | ORAL_TABLET | Freq: Every day | ORAL | Status: DC
  Filled 2020-12-12: qty 1

## 2020-12-12 MED ORDER — SODIUM CHLORIDE 0.9 % IV BOLUS
1000.0000 mL | Freq: Once | INTRAVENOUS | Status: AC
  Administered 2020-12-12: 1000 mL via INTRAVENOUS

## 2020-12-12 MED ORDER — AMOXICILLIN 500 MG PO CAPS
1000.0000 mg | ORAL_CAPSULE | Freq: Two times a day (BID) | ORAL | Status: DC
  Administered 2020-12-13 – 2020-12-17 (×10): 1000 mg via ORAL
  Filled 2020-12-12 (×12): qty 2
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
## 2020-12-12 NOTE — ED Notes (Signed)
PIV insertion attempts unsuccessful at this time. Will get another RN to reattempt

## 2020-12-12 NOTE — ED Triage Notes (Signed)
Pt comes into the ED via [facility] from [facility] c/o emesis and abd pain for the past couple days.  Pt  States that this has been ongoing for 4 weeks.  She has been placed on medication to assist.  Pt is oriented at this time and in NAD with even and unlabored respirations.  Pt also admits to diarrhea and now increased weakness from being sick.
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
## 2020-12-12 NOTE — ED Notes (Signed)
Patient assisted to [facility] by this RN. Patient able to urinate, sample sent to lab.  Patient is very anxious and repeatedly informed this RN "that she gets confused and anxious in different places". Patient easily redirectable.

## 2020-12-12 NOTE — ED Notes (Signed)
Megan RN to re-attempt PIV insertion at this time.

## 2020-12-12 NOTE — ED Notes (Signed)
First Nurse Note: Pt to ED via [facility] from home for vomiting. Started a new med 10 days ago, pt has vomited 6 out of the last 10 days. Pt has not had any vomiting this morning. Pt is from [facility] [place].  Pt does not have hx/o dementia.

## 2020-12-13 ENCOUNTER — Encounter: Payer: Self-pay | Admitting: Internal Medicine

## 2020-12-13 DIAGNOSIS — N1832 Chronic kidney disease, stage 3b: Secondary | ICD-10-CM

## 2020-12-13 DIAGNOSIS — K529 Noninfective gastroenteritis and colitis, unspecified: Secondary | ICD-10-CM | POA: Diagnosis not present

## 2020-12-13 DIAGNOSIS — E876 Hypokalemia: Secondary | ICD-10-CM | POA: Diagnosis not present

## 2020-12-13 LAB — BASIC METABOLIC PANEL
Anion gap: 8 (ref 5–15)
BUN: 21 mg/dL (ref 8–23)
CO2: 20 mmol/L — ABNORMAL LOW (ref 22–32)
Calcium: 9.2 mg/dL (ref 8.9–10.3)
Chloride: 111 mmol/L (ref 98–111)
Creatinine, Ser: 1.65 mg/dL — ABNORMAL HIGH (ref 0.44–1.00)
GFR, Estimated: 31 mL/min — ABNORMAL LOW (ref >60)
Glucose, Bld: 74 mg/dL (ref 70–99)
Potassium: 3.4 mmol/L — ABNORMAL LOW (ref 3.5–5.1)
Sodium: 139 mmol/L (ref 135–145)

## 2020-12-13 LAB — CBC
HCT: 34.7 % — ABNORMAL LOW (ref 36.0–46.0)
Hemoglobin: 11.6 g/dL — ABNORMAL LOW (ref 12.0–15.0)
MCH: 32 pg (ref 26.0–34.0)
MCHC: 33.4 g/dL (ref 30.0–36.0)
MCV: 95.9 fL (ref 80.0–100.0)
Platelets: 200 10*3/uL (ref 150–400)
RBC: 3.62 MIL/uL — ABNORMAL LOW (ref 3.87–5.11)
RDW: 13.7 % (ref 11.5–15.5)
WBC: 8 10*3/uL (ref 4.0–10.5)
nRBC: 0 % (ref 0.0–0.2)

## 2020-12-13 LAB — GASTROINTESTINAL PANEL BY PCR, STOOL (REPLACES STOOL CULTURE)

## 2020-12-13 LAB — C DIFFICILE QUICK SCREEN W PCR REFLEX
C Diff antigen: NEGATIVE
C Diff interpretation: NOT DETECTED
C Diff toxin: NEGATIVE

## 2020-12-13 LAB — SARS CORONAVIRUS 2 (TAT 6-24 HRS): SARS Coronavirus 2: NEGATIVE

## 2020-12-13 MED ORDER — POTASSIUM CHLORIDE CRYS ER 20 MEQ PO TBCR
20.0000 meq | EXTENDED_RELEASE_TABLET | Freq: Once | ORAL | Status: AC
  Administered 2020-12-13: 20 meq via ORAL
  Filled 2020-12-13: qty 1

## 2020-12-13 NOTE — Progress Notes (Signed)
PROGRESS NOTE    Jasmine Butler  HDQ:[id number] DOB: 1935/02/28 DOA: 12/12/2020 PCP: Baxter Hire, MD   Assessment & Plan:   Principal Problem:   Intractable vomiting with nausea Active Problems:   Peripheral neuropathy   Gastroenteritis   Hypothyroid   Prolonged QT interval    Possible gastroenteritis: vs ADRs of h.pylori treatment. Presents w/ abd pain, nausea, vomiting & diarrhea. Zofran prn. GI PCR panel & c. diff ordered. Recently dx w/ H. pylori and started on amoxicillin, clarithromycin & PPI by pt's PCP. Continue on IVF but a reduced rate   H. Pylori: continue on PPI, amoxicillin, clarithromycin  Hypokalemia: KCl repleted. Will continue to monitor   AKI vs CKD: likely CKD considering pt's age, currently stage IIIb. Avoid nephrotoxic meds  Hypothyroidism: continue on home dose of levothyroxine   HLD: continue on statin   HTN: continue on atenolol. Hold home dose of spironolactone   Prolonged QT: will continue on tele   Morbid obesity: BMI 33.3. Complicates overall care & prognosis   DVT prophylaxis: lovenox  Code Status: full Family Communication: called pt's family but no answer Disposition Plan: depends on PT/OT recs   Level of care: Med-Surg   Status is: Observation  The patient remains OBS appropriate and will d/c before 2 midnights.  Dispo: The patient is from: Home              Anticipated d/c is to: Home              Patient currently is not medically stable to d/c.   Difficult to place patient No   Consultants:      Procedures:  Antimicrobials: amoxicillin, clarithromycin   Subjective: Pt c/o nausea   Objective: Vitals:   12/12/20 2231 12/12/20 2243 12/12/20 2300 12/13/20 0431  BP: (!) 115/51 (!) 111/41  (!) 83/65  Pulse: 88 91  90  Resp: 18 18  16   Temp: 97.9 F (36.6 C) 97.6 F (36.4 C)  98 F (36.7 C)  TempSrc: Oral Oral  Oral  SpO2: 97% 99%  97%  Weight:   85.5 kg   Height:        Intake/Output Summary (Last 24  hours) at 12/13/2020 0721 Last data filed at 12/13/2020 0501 Gross per 24 hour  Intake 613.36 ml  Output --  Net 613.36 ml   Filed Weights   12/12/20 1325 12/12/20 2300  Weight: 90.7 kg 85.5 kg    Examination:  General exam: Appears calm and comfortable  Respiratory system: Clear to auscultation. Respiratory effort normal. Cardiovascular system: S1 & S2 +. No rubs, gallops or clicks.  Gastrointestinal system: Abdomen is obese, soft and nontender. Normal bowel sounds heard. Central nervous system: Alert and oriented. Moves all 4 extremities Psychiatry: Judgement and insight appear normal. Tangential thinking.     Data Reviewed: I have personally reviewed following labs and imaging studies  CBC: Recent Labs  Lab 12/12/20 1326 12/13/20 0557  WBC 8.7 8.0  HGB 13.3 11.6*  HCT 40.5 34.7*  MCV 96.2 95.9  PLT 238 119   Basic Metabolic Panel: Recent Labs  Lab 12/12/20 1326 12/13/20 0557  NA 137 139  K 3.6 3.4*  CL 103 111  CO2 24 20*  GLUCOSE 90 74  BUN 24* 21  CREATININE 1.73* 1.65*  CALCIUM 10.3 9.2   GFR: Estimated Creatinine Clearance: 26.8 mL/min (A) (by C-G formula based on SCr of 1.65 mg/dL (H)). Liver Function Tests: Recent Labs  Lab 12/12/20 1326  AST  41  ALT 28  ALKPHOS 97  BILITOT 1.7*  PROT 7.0  ALBUMIN 3.5   Recent Labs  Lab 12/12/20 1326  LIPASE 38   No results for input(s): AMMONIA in the last 168 hours. Coagulation Profile: No results for input(s): INR, PROTIME in the last 168 hours. Cardiac Enzymes: Recent Labs  Lab 12/12/20 1326  CKTOTAL 131   BNP (last 3 results) No results for input(s): PROBNP in the last 8760 hours. HbA1C: No results for input(s): HGBA1C in the last 72 hours. CBG: No results for input(s): GLUCAP in the last 168 hours. Lipid Profile: No results for input(s): CHOL, HDL, LDLCALC, TRIG, CHOLHDL, LDLDIRECT in the last 72 hours. Thyroid Function Tests: No results for input(s): TSH, T4TOTAL, FREET4, T3FREE,  THYROIDAB in the last 72 hours. Anemia Panel: No results for input(s): VITAMINB12, FOLATE, FERRITIN, TIBC, IRON, RETICCTPCT in the last 72 hours. Sepsis Labs: No results for input(s): PROCALCITON, LATICACIDVEN in the last 168 hours.  Recent Results (from the past 240 hour(s))  SARS CORONAVIRUS 2 (TAT 6-24 HRS) Nasopharyngeal Nasopharyngeal Swab     Status: None   Collection Time: 12/12/20  5:51 PM   Specimen: Nasopharyngeal Swab  Result Value Ref Range Status   SARS Coronavirus 2 NEGATIVE NEGATIVE Final    Comment: (NOTE) SARS-CoV-2 target nucleic acids are NOT DETECTED.  The SARS-CoV-2 RNA is generally detectable in upper and lower respiratory specimens during the acute phase of infection. Negative results do not preclude SARS-CoV-2 infection, do not rule out co-infections with other pathogens, and should not be used as the sole basis for treatment or other patient management decisions. Negative results must be combined with clinical observations, patient history, and epidemiological information. The expected result is Negative.  Fact Sheet for Patients: SugarRoll.be  Fact Sheet for Healthcare Providers: https://www.woods-mathews.com/  This test is not yet approved or cleared by the [place] FDA and  has been authorized for detection and/or diagnosis of SARS-CoV-2 by FDA under an Emergency Use Authorization (EUA). This EUA will remain  in effect (meaning this test can be used) for the duration of the COVID-19 declaration under Se ction 564(b)(1) of the Act, 21 U.S.C. section 360bbb-3(b)(1), unless the authorization is terminated or revoked sooner.  Performed at [facility], [place] [address] [place], [place] [address]          Radiology Studies: CT ABDOMEN PELVIS WO CONTRAST  Result Date: 12/12/2020 CLINICAL DATA:  Nausea and vomiting EXAM: CT ABDOMEN AND PELVIS WITHOUT CONTRAST TECHNIQUE: Multidetector CT imaging of  the abdomen and pelvis was performed following the standard protocol without IV contrast. COMPARISON:  CT 12/25/2017 FINDINGS: Lower chest: Smudgy nodule at the RIGHT lung base measures 5 mm (image 5/3). This nodule not changed from 2019. Hepatobiliary: No focal hepatic lesion. No biliary duct dilatation. Common bile duct is normal. Pancreas: Pancreas is normal. No ductal dilatation. No pancreatic inflammation. Spleen: Normal spleen Adrenals/urinary tract: Adrenal glands normal. Nonobstructing renal calculi. Small cyst medial to the LEFT kidney. Ureters and bladder normal. Stomach/Bowel: Stomach demonstrates uniform mild mural thickening. Thickening is mild measuring 12 mm. Duodenum and small-bowel normal. No evidence of bowel obstruction. Appendix and cecum normal. Colon and rectosigmoid colon are normal. Mild sigmoid diverticulosis. No diverticulitis Vascular/Lymphatic: Abdominal aorta is normal caliber with atherosclerotic calcification. There is no retroperitoneal or periportal lymphadenopathy. No pelvic lymphadenopathy. Reproductive: Post hysterectomy.  Adnexa unremarkable Other: No free fluid. Musculoskeletal: No aggressive osseous lesion. IMPRESSION: 1. No acute findings in the abdomen pelvis to  explain nausea and vomiting. No evidence of bowel obstruction. 2. Mild uniform thickening of the stomach wall could represent gastritis. 3. Normal pancreas.  No biliary duct dilatation. 4. Bilateral nonobstructing renal calculi. 5. Mild sigmoid diverticulosis without evidence diverticulitis. Electronically Signed   By: Suzy Bouchard M.D.   On: 12/12/2020 17:01   CT Head Wo Contrast  Result Date: 12/12/2020 CLINICAL DATA:  Facial trauma EXAM: CT HEAD WITHOUT CONTRAST TECHNIQUE: Contiguous axial images were obtained from the base of the skull through the vertex without intravenous contrast. COMPARISON:  Head CT 03/02/2018 FINDINGS: Brain: Age related atrophy. No intracranial hemorrhage, mass effect, or midline  shift. No hydrocephalus. The basilar cisterns are patent. Moderate chronic small vessel ischemia. No evidence of territorial infarct or acute ischemia. No extra-axial or intracranial fluid collection. Vascular: Atherosclerosis of skullbase vasculature without hyperdense vessel or abnormal calcification. Skull: No fracture or focal lesion. Incidental hyperostosis frontalis. Sinuses/Orbits: Paranasal sinuses and mastoid air cells are clear. The visualized orbits are unremarkable. Other: None. IMPRESSION: 1. No acute intracranial abnormality. No skull fracture. 2. Age related atrophy and chronic small vessel ischemia. Electronically Signed   By: Keith Rake M.D.   On: 12/12/2020 16:57        Scheduled Meds: . amoxicillin  1,000 mg Oral BID  . atenolol  25 mg Oral Daily  . atorvastatin  40 mg Oral Daily  . clarithromycin  250 mg Oral BID  . enoxaparin (LOVENOX) injection  30 mg Subcutaneous Q24H  . levothyroxine  100 mcg Oral Daily  . pantoprazole  40 mg Oral BID  . spironolactone  25 mg Oral Daily  . sucralfate  1 g Oral QID   Continuous Infusions: . sodium chloride 125 mL/hr at 12/13/20 9326     LOS: 0 days    Time spent: 35 mins     Wyvonnia Dusky, MD [facility] Pager [phone]  If 7PM-7AM, please contact night-coverage 12/13/2020, 7:21 AM

## 2020-12-13 NOTE — Progress Notes (Signed)
Dr Jimmye Norman made aware that tele reports episode of arrhyhtmia/ tachy arrhythmia-HR 130-150-lasted 13 sec, now back to SR

## 2020-12-13 NOTE — Plan of Care (Signed)
  Problem: Clinical Measurements: Goal: Respiratory complications will improve Outcome: Progressing   Problem: Activity: Goal: Risk for activity intolerance will decrease Outcome: Progressing   Problem: Elimination: Goal: Will not experience complications related to urinary retention Outcome: Progressing   Problem: Safety: Goal: Ability to remain free from injury will improve Outcome: Progressing   Problem: Skin Integrity: Goal: Risk for impaired skin integrity will decrease Outcome: Progressing   Problem: Education: Goal: Knowledge of General Education information will improve Description: Including pain rating scale, medication(s)/side effects and non-pharmacologic comfort measures Outcome: Not Progressing   Problem: Coping: Goal: Level of anxiety will decrease Outcome: Not Progressing

## 2020-12-13 NOTE — Progress Notes (Signed)
Dr Jimmye Norman made aware that morning BP meds held related to soft BP, MD acknowledged

## 2020-12-14 DIAGNOSIS — Z8249 Family history of ischemic heart disease and other diseases of the circulatory system: Secondary | ICD-10-CM | POA: Diagnosis not present

## 2020-12-14 DIAGNOSIS — E039 Hypothyroidism, unspecified: Secondary | ICD-10-CM | POA: Diagnosis present

## 2020-12-14 DIAGNOSIS — R443 Hallucinations, unspecified: Secondary | ICD-10-CM | POA: Diagnosis present

## 2020-12-14 DIAGNOSIS — Z90710 Acquired absence of both cervix and uterus: Secondary | ICD-10-CM | POA: Diagnosis not present

## 2020-12-14 DIAGNOSIS — E860 Dehydration: Secondary | ICD-10-CM | POA: Diagnosis present

## 2020-12-14 DIAGNOSIS — E785 Hyperlipidemia, unspecified: Secondary | ICD-10-CM | POA: Diagnosis present

## 2020-12-14 DIAGNOSIS — Z91013 Allergy to seafood: Secondary | ICD-10-CM | POA: Diagnosis not present

## 2020-12-14 DIAGNOSIS — N179 Acute kidney failure, unspecified: Secondary | ICD-10-CM | POA: Diagnosis present

## 2020-12-14 DIAGNOSIS — Z79890 Hormone replacement therapy (postmenopausal): Secondary | ICD-10-CM | POA: Diagnosis not present

## 2020-12-14 DIAGNOSIS — I10 Essential (primary) hypertension: Secondary | ICD-10-CM | POA: Diagnosis not present

## 2020-12-14 DIAGNOSIS — E876 Hypokalemia: Secondary | ICD-10-CM | POA: Diagnosis present

## 2020-12-14 DIAGNOSIS — Z79899 Other long term (current) drug therapy: Secondary | ICD-10-CM | POA: Diagnosis not present

## 2020-12-14 DIAGNOSIS — R9431 Abnormal electrocardiogram [ECG] [EKG]: Secondary | ICD-10-CM | POA: Diagnosis present

## 2020-12-14 DIAGNOSIS — B9681 Helicobacter pylori [H. pylori] as the cause of diseases classified elsewhere: Secondary | ICD-10-CM | POA: Diagnosis present

## 2020-12-14 DIAGNOSIS — A048 Other specified bacterial intestinal infections: Secondary | ICD-10-CM | POA: Diagnosis not present

## 2020-12-14 DIAGNOSIS — Y92009 Unspecified place in unspecified non-institutional (private) residence as the place of occurrence of the external cause: Secondary | ICD-10-CM | POA: Diagnosis not present

## 2020-12-14 DIAGNOSIS — N1832 Chronic kidney disease, stage 3b: Secondary | ICD-10-CM | POA: Diagnosis not present

## 2020-12-14 DIAGNOSIS — Z6833 Body mass index (BMI) 33.0-33.9, adult: Secondary | ICD-10-CM | POA: Diagnosis not present

## 2020-12-14 DIAGNOSIS — K529 Noninfective gastroenteritis and colitis, unspecified: Secondary | ICD-10-CM | POA: Diagnosis present

## 2020-12-14 DIAGNOSIS — G629 Polyneuropathy, unspecified: Secondary | ICD-10-CM | POA: Diagnosis present

## 2020-12-14 DIAGNOSIS — W1839XA Other fall on same level, initial encounter: Secondary | ICD-10-CM | POA: Diagnosis present

## 2020-12-14 DIAGNOSIS — I129 Hypertensive chronic kidney disease with stage 1 through stage 4 chronic kidney disease, or unspecified chronic kidney disease: Secondary | ICD-10-CM | POA: Diagnosis present

## 2020-12-14 DIAGNOSIS — N1831 Chronic kidney disease, stage 3a: Secondary | ICD-10-CM | POA: Diagnosis present

## 2020-12-14 DIAGNOSIS — Z91018 Allergy to other foods: Secondary | ICD-10-CM | POA: Diagnosis not present

## 2020-12-14 DIAGNOSIS — Z20822 Contact with and (suspected) exposure to covid-19: Secondary | ICD-10-CM | POA: Diagnosis present

## 2020-12-14 DIAGNOSIS — Z8701 Personal history of pneumonia (recurrent): Secondary | ICD-10-CM | POA: Diagnosis not present

## 2020-12-14 LAB — CBC
HCT: 36.9 % (ref 36.0–46.0)
Hemoglobin: 12 g/dL (ref 12.0–15.0)
MCH: 31.4 pg (ref 26.0–34.0)
MCHC: 32.5 g/dL (ref 30.0–36.0)
MCV: 96.6 fL (ref 80.0–100.0)
Platelets: 205 10*3/uL (ref 150–400)
RBC: 3.82 MIL/uL — ABNORMAL LOW (ref 3.87–5.11)
RDW: 14 % (ref 11.5–15.5)
WBC: 8.8 10*3/uL (ref 4.0–10.5)
nRBC: 0 % (ref 0.0–0.2)

## 2020-12-14 LAB — BASIC METABOLIC PANEL
Anion gap: 5 (ref 5–15)
BUN: 23 mg/dL (ref 8–23)
CO2: 22 mmol/L (ref 22–32)
Calcium: 9.3 mg/dL (ref 8.9–10.3)
Chloride: 112 mmol/L — ABNORMAL HIGH (ref 98–111)
Creatinine, Ser: 1.53 mg/dL — ABNORMAL HIGH (ref 0.44–1.00)
GFR, Estimated: 34 mL/min — ABNORMAL LOW (ref >60)
Glucose, Bld: 97 mg/dL (ref 70–99)
Potassium: 3.4 mmol/L — ABNORMAL LOW (ref 3.5–5.1)
Sodium: 139 mmol/L (ref 135–145)

## 2020-12-14 LAB — MAGNESIUM: Magnesium: 1.5 mg/dL — ABNORMAL LOW (ref 1.7–2.4)

## 2020-12-14 MED ORDER — POTASSIUM CHLORIDE CRYS ER 20 MEQ PO TBCR
40.0000 meq | EXTENDED_RELEASE_TABLET | Freq: Once | ORAL | Status: AC
  Administered 2020-12-14: 40 meq via ORAL
  Filled 2020-12-14: qty 2

## 2020-12-14 MED ORDER — SUCRALFATE 1 G PO TABS
1.0000 g | ORAL_TABLET | Freq: Three times a day (TID) | ORAL | Status: DC
  Administered 2020-12-14 – 2020-12-17 (×10): 1 g via ORAL
  Filled 2020-12-14 (×10): qty 1

## 2020-12-14 MED ORDER — MAGNESIUM SULFATE 2 GM/50ML IV SOLN
2.0000 g | Freq: Once | INTRAVENOUS | Status: AC
  Administered 2020-12-14: 12:00:00 2 g via INTRAVENOUS
  Filled 2020-12-14: qty 50

## 2020-12-14 NOTE — Progress Notes (Signed)
PROGRESS NOTE    Jasmine Butler  SHF:[id number] DOB: 1935-09-27 DOA: 12/12/2020 PCP: Baxter Hire, MD   Assessment & Plan:   Principal Problem:   Intractable vomiting with nausea Active Problems:   Peripheral neuropathy   Gastroenteritis   Hypothyroid   Prolonged QT interval    Possible gastroenteritis: vs ADRs of h. pylori treatment. Presented w/ abd pain, N/V & diarrhea. Zofran prn. GI PCR panel & c. diff are both neg. Continue on previously started amoxicillin, clarithromycin, & PPI.   H. pylori: continue on clarithromycin, amoxicillin, PPI   Hypokalemia: potassium given. Will continue to monitor  Hypomagnesemia: MgSO4 ordered. Will continue to monitor   AKI vs CKD: likely CKD considering pt's age. Cr is trending down from day prior  Hypothyroidism: continue on home dose synthroid   HLD: continue on statin   HTN: continue on home dose of atenolol. Hold home dose of spironolactone   Prolonged QT: QTc on EKG 12/13/20 <500   Morbid obesity: BMI 33.3. Complicates overall care and prognosis    DVT prophylaxis: lovenox  Code Status: full Family Communication: called pt's daughter but no answer again today  Disposition Plan: likely d/c to SNF   Level of care: Med-Surg   Status is: Observation  The patient remains OBS appropriate and will d/c before 2 midnights.  Dispo: The patient is from: Home              Anticipated d/c is to:  SNF               Patient currently is not medically stable to d/c.   Difficult to place patient yes   Consultants:      Procedures:  Antimicrobials: amoxicillin, clarithromycin   Subjective: Pt c/o lower abd pain   Objective: Vitals:   12/13/20 1846 12/13/20 2122 12/14/20 0606 12/14/20 0849  BP:  (!) 105/58 107/78 114/60  Pulse: 79 72 88 81  Resp: 20 16 16 18   Temp:  98.6 F (37 C) 97.7 F (36.5 C) 97.7 F (36.5 C)  TempSrc:  Oral Oral   SpO2: 94% 98% 100% 100%  Weight:      Height:        Intake/Output  Summary (Last 24 hours) at 12/14/2020 1110 Last data filed at 12/13/2020 1700 Gross per 24 hour  Intake 789.09 ml  Output --  Net 789.09 ml   Filed Weights   12/12/20 1325 12/12/20 2300  Weight: 90.7 kg 85.5 kg    Examination:  General exam: Appears comfortable  Respiratory system: clear breath sounds b/l. No rales or rhonchi  Cardiovascular system: S1/S2+. No rubs or gallops  Gastrointestinal system: Abd is soft, NT, obese & hyperactive bowel sounds  Central nervous system: Alert and oriented. Moves all 4 extremities  Psychiatry: Judgement and insight appear normal. Tangential thinking     Data Reviewed: I have personally reviewed following labs and imaging studies  CBC: Recent Labs  Lab 12/12/20 1326 12/13/20 0557 12/14/20 0558  WBC 8.7 8.0 8.8  HGB 13.3 11.6* 12.0  HCT 40.5 34.7* 36.9  MCV 96.2 95.9 96.6  PLT 238 200 502   Basic Metabolic Panel: Recent Labs  Lab 12/12/20 1326 12/13/20 0557 12/14/20 0558  NA 137 139 139  K 3.6 3.4* 3.4*  CL 103 111 112*  CO2 24 20* 22  GLUCOSE 90 74 97  BUN 24* 21 23  CREATININE 1.73* 1.65* 1.53*  CALCIUM 10.3 9.2 9.3  MG  --   --  1.5*  GFR: Estimated Creatinine Clearance: 28.9 mL/min (A) (by C-G formula based on SCr of 1.53 mg/dL (H)). Liver Function Tests: Recent Labs  Lab 12/12/20 1326  AST 41  ALT 28  ALKPHOS 97  BILITOT 1.7*  PROT 7.0  ALBUMIN 3.5   Recent Labs  Lab 12/12/20 1326  LIPASE 38   No results for input(s): AMMONIA in the last 168 hours. Coagulation Profile: No results for input(s): INR, PROTIME in the last 168 hours. Cardiac Enzymes: Recent Labs  Lab 12/12/20 1326  CKTOTAL 131   BNP (last 3 results) No results for input(s): PROBNP in the last 8760 hours. HbA1C: No results for input(s): HGBA1C in the last 72 hours. CBG: No results for input(s): GLUCAP in the last 168 hours. Lipid Profile: No results for input(s): CHOL, HDL, LDLCALC, TRIG, CHOLHDL, LDLDIRECT in the last 72  hours. Thyroid Function Tests: No results for input(s): TSH, T4TOTAL, FREET4, T3FREE, THYROIDAB in the last 72 hours. Anemia Panel: No results for input(s): VITAMINB12, FOLATE, FERRITIN, TIBC, IRON, RETICCTPCT in the last 72 hours. Sepsis Labs: No results for input(s): PROCALCITON, LATICACIDVEN in the last 168 hours.  Recent Results (from the past 240 hour(s))  SARS CORONAVIRUS 2 (TAT 6-24 HRS) Nasopharyngeal Nasopharyngeal Swab     Status: None   Collection Time: 12/12/20  5:51 PM   Specimen: Nasopharyngeal Swab  Result Value Ref Range Status   SARS Coronavirus 2 NEGATIVE NEGATIVE Final    Comment: (NOTE) SARS-CoV-2 target nucleic acids are NOT DETECTED.  The SARS-CoV-2 RNA is generally detectable in upper and lower respiratory specimens during the acute phase of infection. Negative results do not preclude SARS-CoV-2 infection, do not rule out co-infections with other pathogens, and should not be used as the sole basis for treatment or other patient management decisions. Negative results must be combined with clinical observations, patient history, and epidemiological information. The expected result is Negative.  Fact Sheet for Patients: SugarRoll.be  Fact Sheet for Healthcare Providers: https://www.woods-mathews.com/  This test is not yet approved or cleared by the [place] FDA and  has been authorized for detection and/or diagnosis of SARS-CoV-2 by FDA under an Emergency Use Authorization (EUA). This EUA will remain  in effect (meaning this test can be used) for the duration of the COVID-19 declaration under Se ction 564(b)(1) of the Act, 21 U.S.C. section 360bbb-3(b)(1), unless the authorization is terminated or revoked sooner.  Performed at [facility], [place] [address], [place], [place] [address]   Gastrointestinal Panel by PCR , Stool     Status: None   Collection Time: 12/13/20  3:00 PM   Specimen: Stool  Result  Value Ref Range Status   Campylobacter species NOT DETECTED NOT DETECTED Final   Plesimonas shigelloides NOT DETECTED NOT DETECTED Final   Salmonella species NOT DETECTED NOT DETECTED Final   Yersinia enterocolitica NOT DETECTED NOT DETECTED Final   Vibrio species NOT DETECTED NOT DETECTED Final   Vibrio cholerae NOT DETECTED NOT DETECTED Final   Enteroaggregative E coli (EAEC) NOT DETECTED NOT DETECTED Final   Enteropathogenic E coli (EPEC) NOT DETECTED NOT DETECTED Final   Enterotoxigenic E coli (ETEC) NOT DETECTED NOT DETECTED Final   Shiga like toxin producing E coli (STEC) NOT DETECTED NOT DETECTED Final   Shigella/Enteroinvasive E coli (EIEC) NOT DETECTED NOT DETECTED Final   Cryptosporidium NOT DETECTED NOT DETECTED Final   Cyclospora cayetanensis NOT DETECTED NOT DETECTED Final   Entamoeba histolytica NOT DETECTED NOT DETECTED Final   Giardia lamblia NOT  DETECTED NOT DETECTED Final   Adenovirus F40/41 NOT DETECTED NOT DETECTED Final   Astrovirus NOT DETECTED NOT DETECTED Final   Norovirus GI/GII NOT DETECTED NOT DETECTED Final   Rotavirus A NOT DETECTED NOT DETECTED Final   Sapovirus (I, II, IV, and V) NOT DETECTED NOT DETECTED Final    Comment: Performed at [facility], [place], [place] [address]  C Difficile Quick Screen w PCR reflex     Status: None   Collection Time: 12/13/20  3:00 PM   Specimen: Stool  Result Value Ref Range Status   C Diff antigen NEGATIVE NEGATIVE Final   C Diff toxin NEGATIVE NEGATIVE Final   C Diff interpretation No C. difficile detected.  Final    Comment: Performed at [facility], [place] [address] [place], [place] [address]         Radiology Studies: CT ABDOMEN PELVIS WO CONTRAST  Result Date: 12/12/2020 CLINICAL DATA:  Nausea and vomiting EXAM: CT ABDOMEN AND PELVIS WITHOUT CONTRAST TECHNIQUE: Multidetector CT imaging of the abdomen and pelvis was performed following the standard protocol without IV  contrast. COMPARISON:  CT 12/25/2017 FINDINGS: Lower chest: Smudgy nodule at the RIGHT lung base measures 5 mm (image 5/3). This nodule not changed from 2019. Hepatobiliary: No focal hepatic lesion. No biliary duct dilatation. Common bile duct is normal. Pancreas: Pancreas is normal. No ductal dilatation. No pancreatic inflammation. Spleen: Normal spleen Adrenals/urinary tract: Adrenal glands normal. Nonobstructing renal calculi. Small cyst medial to the LEFT kidney. Ureters and bladder normal. Stomach/Bowel: Stomach demonstrates uniform mild mural thickening. Thickening is mild measuring 12 mm. Duodenum and small-bowel normal. No evidence of bowel obstruction. Appendix and cecum normal. Colon and rectosigmoid colon are normal. Mild sigmoid diverticulosis. No diverticulitis Vascular/Lymphatic: Abdominal aorta is normal caliber with atherosclerotic calcification. There is no retroperitoneal or periportal lymphadenopathy. No pelvic lymphadenopathy. Reproductive: Post hysterectomy.  Adnexa unremarkable Other: No free fluid. Musculoskeletal: No aggressive osseous lesion. IMPRESSION: 1. No acute findings in the abdomen pelvis to explain nausea and vomiting. No evidence of bowel obstruction. 2. Mild uniform thickening of the stomach wall could represent gastritis. 3. Normal pancreas.  No biliary duct dilatation. 4. Bilateral nonobstructing renal calculi. 5. Mild sigmoid diverticulosis without evidence diverticulitis. Electronically Signed   By: Suzy Bouchard M.D.   On: 12/12/2020 17:01   CT Head Wo Contrast  Result Date: 12/12/2020 CLINICAL DATA:  Facial trauma EXAM: CT HEAD WITHOUT CONTRAST TECHNIQUE: Contiguous axial images were obtained from the base of the skull through the vertex without intravenous contrast. COMPARISON:  Head CT 03/02/2018 FINDINGS: Brain: Age related atrophy. No intracranial hemorrhage, mass effect, or midline shift. No hydrocephalus. The basilar cisterns are patent. Moderate chronic small  vessel ischemia. No evidence of territorial infarct or acute ischemia. No extra-axial or intracranial fluid collection. Vascular: Atherosclerosis of skullbase vasculature without hyperdense vessel or abnormal calcification. Skull: No fracture or focal lesion. Incidental hyperostosis frontalis. Sinuses/Orbits: Paranasal sinuses and mastoid air cells are clear. The visualized orbits are unremarkable. Other: None. IMPRESSION: 1. No acute intracranial abnormality. No skull fracture. 2. Age related atrophy and chronic small vessel ischemia. Electronically Signed   By: Keith Rake M.D.   On: 12/12/2020 16:57        Scheduled Meds: . amoxicillin  1,000 mg Oral BID  . atenolol  25 mg Oral Daily  . atorvastatin  40 mg Oral Daily  . clarithromycin  250 mg Oral BID  . enoxaparin (LOVENOX) injection  30 mg Subcutaneous  Q24H  . levothyroxine  100 mcg Oral Daily  . pantoprazole  40 mg Oral BID  . sucralfate  1 g Oral QID   Continuous Infusions: . magnesium sulfate bolus IVPB       LOS: 0 days    Time spent: 30 mins     Wyvonnia Dusky, MD [facility] Pager [phone]  If 7PM-7AM, please contact night-coverage 12/14/2020, 11:10 AM

## 2020-12-14 NOTE — Evaluation (Signed)
Physical Therapy Evaluation Patient Details Name: Jasmine Butler MRN: [id number] DOB: 02/01/1935 Today's Date: 12/14/2020   History of Present Illness  Pt admitted for intractable N/C. History includes neuropathy and H phylori. Restident at [place].  Clinical Impression  Pt is a pleasant 86 year old female who was admitted for intractable N/V. Pt performs bed mobility/ transfers with mod assist and ambulation with min assist and RW. Pt confused and hallucinating seeing spiders on the floor, RN made aware. Pt demonstrates deficits with strength/mobility/balance. Needs to use RW at all times and educated to not get up without assistance. O2 sats decrease with exertion, no hx of O2. Doesn't appear to be at baseline status and is at increased falls risk. Would benefit from skilled PT to address above deficits and promote optimal return to PLOF; recommend transition to STR upon discharge from acute hospitalization.     Follow Up Recommendations SNF    Equipment Recommendations   (TBD)    Recommendations for Other Services       Precautions / Restrictions Precautions Precautions: Fall Restrictions Weight Bearing Restrictions: No      Mobility  Bed Mobility Overal bed mobility: Needs Assistance Bed Mobility: Supine to Sit     Supine to sit: Mod assist     General bed mobility comments: needs assist with B LEs. Once seated at EOB, upright posture noted. Takes extended time for processing commands    Transfers Overall transfer level: Needs assistance Equipment used: Rolling walker (2 wheeled) Transfers: Sit to/from Stand Sit to Stand: Mod assist         General transfer comment: needs momentum to stand and cues for pushing from seated surface. Once standing, upright posture noted. Cues to stay close to RW. Pre gait activities noted including alt marching.  Ambulation/Gait Ambulation/Gait assistance: Min assist Gait Distance (Feet): 30 Feet Assistive device: Rolling walker  (2 wheeled) Gait Pattern/deviations: Step-to pattern     General Gait Details: tends to leave RW behind needing cues for safety. Moans in pain with mobility. Has difficulty with obstacle avoidance. O2 sats decrease to 83% on 1st bout and then 77% on 2nd bout of ambulation. Recovers to 99% with seated rest break.  Stairs            Wheelchair Mobility    Modified Rankin (Stroke Patients Only)       Balance Overall balance assessment: Needs assistance;History of Falls Sitting-balance support: No upper extremity supported;Feet supported Sitting balance-Leahy Scale: Good     Standing balance support: Bilateral upper extremity supported Standing balance-Leahy Scale: Fair                               Pertinent Vitals/Pain Pain Assessment: 0-10 Pain Score: 8  Pain Location: abdomen Pain Descriptors / Indicators: Aching Pain Intervention(s): Limited activity within patient's tolerance;Repositioned (RN notified)    Home Living Family/patient expects to be discharged to:: Private residence Living Arrangements: Alone   Type of Home: Independent living facility Home Access: [place]: One level Home Equipment: Environmental consultant - 2 wheels;Cane - single point Additional Comments: Resident at [facility]. Reports she lives on 3rd story and can often take stairs but has access to elevator if needed.    Prior Function Level of Independence: Independent         Comments: Typically indep despite report that she has been advised to use AD. Reports ~5 recent falls.  Hand Dominance        Extremity/Trunk Assessment   Upper Extremity Assessment Upper Extremity Assessment: Generalized weakness (B UE grossly 4/5)    Lower Extremity Assessment Lower Extremity Assessment: Generalized weakness (B LE grossly 3/5)       Communication   Communication: No difficulties (tangential and verbose)  Cognition Arousal/Alertness: Awake/alert Behavior During Therapy:  WFL for tasks assessed/performed Overall Cognitive Status: Difficult to assess                                 General Comments: appears confused, often repeating information. Also noted hallucinations seeing spiders on floor etc. RN made aware      General Comments      Exercises Other Exercises Other Exercises: seated ther-ex performed including AP, LAQ, alt marcing x 10 reps with min assist. Cues for sequencing. Other Exercises: Ambulated to bathroom with heavy cues for sequencing and assist needed for doffing undergarments. Appears very confused related to hygiene   Assessment/Plan    PT Assessment Patient needs continued PT services  PT Problem List Decreased strength;Decreased activity tolerance;Decreased balance;Decreased mobility;Decreased cognition;Decreased knowledge of use of DME;Decreased safety awareness;Cardiopulmonary status limiting activity;Pain       PT Treatment Interventions DME instruction;Gait training;Therapeutic exercise;Balance training    PT Goals (Current goals can be found in the Care Plan section)  Acute Rehab PT Goals Patient Stated Goal: to go home PT Goal Formulation: With patient Time For Goal Achievement: 12/28/20 Potential to Achieve Goals: Good    Frequency Min 2X/week   Barriers to discharge        Co-evaluation               AM-PAC PT "6 Clicks" Mobility  Outcome Measure Help needed turning from your back to your side while in a flat bed without using bedrails?: A Little Help needed moving from lying on your back to sitting on the side of a flat bed without using bedrails?: A Little Help needed moving to and from a bed to a chair (including a wheelchair)?: A Little Help needed standing up from a chair using your arms (e.g., wheelchair or bedside chair)?: A Lot Help needed to walk in hospital room?: A Little Help needed climbing 3-5 steps with a railing? : A Lot 6 Click Score: 16    End of Session Equipment  Utilized During Treatment: Gait belt Activity Tolerance: Patient limited by pain Patient left: in chair;with chair alarm set (unsure of accuracy of chair alarm, left door open) Nurse Communication: Mobility status PT Visit Diagnosis: Unsteadiness on feet (R26.81);Repeated falls (R29.6);Muscle weakness (generalized) (M62.81);Difficulty in walking, not elsewhere classified (R26.2);Pain Pain - Right/Left:  (abdomen)    Time: 1610-9604 PT Time Calculation (min) (ACUTE ONLY): 37 min   Charges:   PT Evaluation $PT Eval Low Complexity: 1 Low PT Treatments $Therapeutic Exercise: 8-22 mins $Therapeutic Activity: 8-22 mins        Greggory Stallion, PT, DPT [phone]   Ray,Stephanie 12/14/2020, 10:39 AM

## 2020-12-14 NOTE — Evaluation (Signed)
Occupational Therapy Evaluation Patient Details Name: Gaile Allmon MRN: [id number] DOB: December 23, 1934 Today's Date: 12/14/2020    History of Present Illness Pt admitted for intractable vomiting with nausea. History includes neuropathy and H phylori. Restident at [place].   Clinical Impression   Pt seen for OT evaluation this date. Upon arrival to room, pt in bed and requesting assistance to use toilet. Pt agreeable to assist from OT and agreeable to OT evaluation/tx. PLOF difficult to obtain d/t pt's decreased sustained attention, with pt tangential and often repeating information regarding medical course and confusion with using call button. Pt is aware that she was having hallucinations during admission. Per chart review, pt was living in an ILF and reports being independent with functional mobility without AD (~5 recent falls). Pt reports that she is not performing ADLs/functional mobility at baseline level. Pt currently presents with decreased attention, safety awareness, balance, and activity tolerance, and requires SUPERVISION for bed mobility, MIN GUARD for transfers with RW, MIN GUARD for toilet hygiene, MIN A for standing grooming, and MIN A for functional mobility with RW. Pt would benefit from additional skilled OT services to maximize return to PLOF and minimize risk of future falls, injury, caregiver burden, and readmission. Upon discharge, recommend SNF.     Follow Up Recommendations  SNF    Equipment Recommendations  Other (comment) (defer to next venue of care)       Precautions / Restrictions Precautions Precautions: Fall Restrictions Weight Bearing Restrictions: No      Mobility Bed Mobility Overal bed mobility: Needs Assistance Bed Mobility: Supine to Sit;Sit to Supine     Supine to sit: Supervision;HOB elevated Sit to supine: Supervision   General bed mobility comments: Required increased time and effort only    Transfers Overall transfer level: Needs  assistance Equipment used: Rolling walker (2 wheeled) Transfers: Sit to/from Stand Sit to Stand: Min guard         General transfer comment: Required verbal cues for safe hand placement and MIN GUARD for sit<>stand transfers with RW. MIN A for steadying during sit<>stand transfers w/out AD.    Balance Overall balance assessment: Needs assistance;History of Falls Sitting-balance support: Bilateral upper extremity supported;Feet supported Sitting balance-Leahy Scale: Good Sitting balance - Comments: Good sitting balance on toilet with b/l forearms resting on RW   Standing balance support: No upper extremity supported;During functional activity Standing balance-Leahy Scale: Poor Standing balance comment: MIN A for steadying during hand washing while standing                           ADL either performed or assessed with clinical judgement   ADL Overall ADL's : Needs assistance/impaired     Grooming: Wash/dry hands;Standing;Minimal assistance;Wash/dry face;Oral care;Sitting                   Toilet Transfer: Min guard;Ambulation;Comfort height toilet;Grab bars;RW   Toileting- Water quality scientist and Hygiene: Min guard;Sit to/from stand Toileting - Clothing Manipulation Details (indicate cue type and reason): Pt able to don/doff underwear and perform peri-care with unilateral UE support on RW     Functional mobility during ADLs: Minimal assistance;Rolling walker General ADL Comments: Intermittent MIN A required for RW management during functional mobility     Vision Baseline Vision/History: Wears glasses Wears Glasses: At all times              Pertinent Vitals/Pain Pain Assessment: Faces Faces Pain Scale: Hurts even more Pain Location:  abdomen Pain Descriptors / Indicators: Aching Pain Intervention(s): Limited activity within patient's tolerance;Monitored during session;Repositioned        Extremity/Trunk Assessment Upper Extremity  Assessment Upper Extremity Assessment: Generalized weakness (grossly 4/5 bilaterally)   Lower Extremity Assessment Lower Extremity Assessment: Defer to PT evaluation       Communication Communication Communication: No difficulties (tangential and verbose)   Cognition Arousal/Alertness: Awake/alert Behavior During Therapy: WFL for tasks assessed/performed Overall Cognitive Status: No family/caregiver present to determine baseline cognitive functioning                                 General Comments: Decreased sustained attention d/t pt being tangential and often repeating information. Aware that she was having hallucinations during admission.              Home Living Family/patient expects to be discharged to:: Private residence Living Arrangements: Alone   Type of Home: Independent living facility Home Access: [place]: One [place]: Environmental consultant - 2 wheels;Cane - single point   Additional Comments: Resident at [facility]. Reports she lives on 3rd story and can often take stairs but has access to elevator if needed.      Prior Functioning/Environment Level of Independence: Independent        Comments: Typically independent despite report that she has been advised to use AD. Reports ~5 recent falls.        OT Problem List: Decreased strength;Decreased activity tolerance;Impaired balance (sitting and/or standing);Decreased cognition;Decreased safety awareness      OT Treatment/Interventions: Self-care/ADL training;Therapeutic exercise;Energy conservation;DME and/or AE instruction;Therapeutic activities;Patient/family education;Balance training    OT Goals(Current goals can be found in the care plan section) Acute Rehab OT Goals Patient Stated Goal: to feel stronger OT Goal Formulation: With patient Time For Goal Achievement: 12/28/20 Potential to Achieve Goals: Good ADL Goals Pt Will Perform Grooming: with  modified independence;standing Pt Will Perform Upper Body Bathing: with supervision;standing Pt Will Perform Lower Body Dressing: with modified independence;sit to/from stand  OT Frequency: Min 2X/week    AM-PAC OT "6 Clicks" Daily Activity     Outcome Measure Help from another person eating meals?: None Help from another person taking care of personal grooming?: A Little Help from another person toileting, which includes using toliet, bedpan, or urinal?: A Little Help from another person bathing (including washing, rinsing, drying)?: A Lot Help from another person to put on and taking off regular upper body clothing?: A Little Help from another person to put on and taking off regular lower body clothing?: A Lot 6 Click Score: 17   End of Session Equipment Utilized During Treatment: Gait belt;Rolling walker Nurse Communication: Mobility status;Other (comment) (chair alarm pad not pairing with posey device)  Activity Tolerance: Patient tolerated treatment well Patient left: in bed;with call bell/phone within reach;with bed alarm set;with nursing/sitter in room  OT Visit Diagnosis: Unsteadiness on feet (R26.81);Muscle weakness (generalized) (M62.81)                Time: 4174-0814 OT Time Calculation (min): 35 min Charges:  OT General Charges $OT Visit: 1 Visit OT Evaluation $OT Eval Moderate Complexity: 1 Mod OT Treatments $Self Care/Home Management : 23-37 mins  Fredirick Maudlin, OTR/L [place]

## 2020-12-15 DIAGNOSIS — I10 Essential (primary) hypertension: Secondary | ICD-10-CM

## 2020-12-15 LAB — BASIC METABOLIC PANEL
Anion gap: 5 (ref 5–15)
BUN: 23 mg/dL (ref 8–23)
CO2: 21 mmol/L — ABNORMAL LOW (ref 22–32)
Calcium: 9.2 mg/dL (ref 8.9–10.3)
Chloride: 113 mmol/L — ABNORMAL HIGH (ref 98–111)
Creatinine, Ser: 1.55 mg/dL — ABNORMAL HIGH (ref 0.44–1.00)
GFR, Estimated: 33 mL/min — ABNORMAL LOW (ref >60)
Glucose, Bld: 116 mg/dL — ABNORMAL HIGH (ref 70–99)
Potassium: 4 mmol/L (ref 3.5–5.1)
Sodium: 139 mmol/L (ref 135–145)

## 2020-12-15 LAB — CBC
HCT: 34 % — ABNORMAL LOW (ref 36.0–46.0)
Hemoglobin: 11.5 g/dL — ABNORMAL LOW (ref 12.0–15.0)
MCH: 31.9 pg (ref 26.0–34.0)
MCHC: 33.8 g/dL (ref 30.0–36.0)
MCV: 94.2 fL (ref 80.0–100.0)
Platelets: 181 10*3/uL (ref 150–400)
RBC: 3.61 MIL/uL — ABNORMAL LOW (ref 3.87–5.11)
RDW: 14.3 % (ref 11.5–15.5)
WBC: 8.2 10*3/uL (ref 4.0–10.5)
nRBC: 0 % (ref 0.0–0.2)

## 2020-12-15 LAB — MAGNESIUM: Magnesium: 1.8 mg/dL (ref 1.7–2.4)

## 2020-12-15 MED ORDER — SPIRONOLACTONE 25 MG PO TABS
25.0000 mg | ORAL_TABLET | Freq: Every day | ORAL | Status: DC
  Administered 2020-12-15 – 2020-12-16 (×2): 25 mg via ORAL
  Filled 2020-12-15 (×2): qty 1

## 2020-12-15 NOTE — NC FL2 (Signed)
[place] LEVEL OF CARE SCREENING TOOL     IDENTIFICATION  Patient Name: Jasmine Butler Birthdate: 03-28-1935 Sex: female Admission Date (Current Location): 12/12/2020  [place] and [place] Number:  Engineering geologist and Address:  [facility], [address], [place], [place] [address]      Provider Number: [id number]  Attending Physician Name and Address:  Wyvonnia Dusky, MD  Relative Name and Phone Number:  Verdis Frederickson (daughter)  [phone]    Current Level of Care: Hospital Recommended Level of Care: [place] Prior Approval Number:    Date Approved/Denied:   PASRR Number: [id number]  Discharge Plan: SNF    Current Diagnoses: Patient Active Problem List   Diagnosis Date Noted  . Intractable vomiting with nausea 12/12/2020  . Gastroenteritis 12/12/2020  . Hypothyroid 12/12/2020  . Prolonged QT interval 12/12/2020  . Peripheral neuropathy 08/01/2019  . Community acquired pneumonia 08/22/2018    Orientation RESPIRATION BLADDER Height & Weight     Self,Place  Normal Continent Weight: 85.5 kg Height:  5\' 3"  (160 cm)  BEHAVIORAL SYMPTOMS/MOOD NEUROLOGICAL BOWEL NUTRITION STATUS      Continent Diet (Heart Healthy)  AMBULATORY STATUS COMMUNICATION OF NEEDS Skin   Limited Assist Verbally Normal                       Personal Care Assistance Level of Assistance  Bathing,Feeding,Dressing Bathing Assistance: Limited assistance Feeding assistance: Independent Dressing Assistance: Limited assistance     Functional Limitations Info             SPECIAL CARE FACTORS FREQUENCY  PT (By licensed PT),OT (By licensed OT)     PT Frequency: 5 times per week OT Frequency: 5 times per week            Contractures Contractures Info: Not present    Additional Factors Info  Code Status,Allergies Code Status Info: Full Allergies Info: Chicken and shellfish           Current Medications  (12/15/2020):  This is the current hospital active medication list Current Facility-Administered Medications  Medication Dose Route Frequency Provider Last Rate Last Admin  . acetaminophen (TYLENOL) tablet 325 mg  325 mg Oral Q6H PRN Cox, Amy N, DO       Or  . acetaminophen (TYLENOL) suppository 325 mg  325 mg Rectal Q6H PRN Cox, Amy N, DO      . amoxicillin (AMOXIL) capsule 1,000 mg  1,000 mg Oral BID Cox, Amy N, DO   1,000 mg at 12/15/20 0905  . atenolol (TENORMIN) tablet 25 mg  25 mg Oral Daily Cox, Amy N, DO   25 mg at 12/15/20 0907  . atorvastatin (LIPITOR) tablet 40 mg  40 mg Oral Daily Cox, Amy N, DO      . clarithromycin (BIAXIN) tablet 250 mg  250 mg Oral BID Cox, Amy N, DO   250 mg at 12/15/20 0906  . enoxaparin (LOVENOX) injection 30 mg  30 mg Subcutaneous Q24H Cox, Amy N, DO   30 mg at 12/15/20 0908  . levothyroxine (SYNTHROID) tablet 100 mcg  100 mcg Oral Daily Cox, Amy N, DO   100 mcg at 12/15/20 0601  . ondansetron (ZOFRAN) tablet 4 mg  4 mg Oral Q6H PRN Cox, Amy N, DO       Or  . ondansetron (ZOFRAN) injection 4 mg  4 mg Intravenous Q6H PRN Cox, Amy N, DO      .  pantoprazole (PROTONIX) EC tablet 40 mg  40 mg Oral BID Cox, Amy N, DO   40 mg at 12/15/20 0907  . spironolactone (ALDACTONE) tablet 25 mg  25 mg Oral Daily Wyvonnia Dusky, MD   25 mg at 12/15/20 1311  . sucralfate (CARAFATE) tablet 1 g  1 g Oral TID WC & HS Dorothe Pea, RPH   1 g at 12/15/20 1735     Discharge Medications: Please see discharge summary for a list of discharge medications.  Relevant Imaging Results:  Relevant Lab Results:   Additional Information SS# [id number]  Shelbie Hutching, RN

## 2020-12-15 NOTE — TOC Initial Note (Signed)
Transition of Care [facility]) - Initial/Assessment Note    Patient Details  Name: Jasmine Butler MRN: [id number] Date of Birth: [birth year]  Transition of Care [facility]) CM/SW Contact:    Shelbie Hutching, RN Phone Number: 12/15/2020, 6:09 PM  Clinical Narrative:                 Patient admitted to the hospital with intractable nausea and vomiting and acute gastritis.  RNCM was able to meet with patient at the beside.  Patient reports that she is from [facility] independent living.  Patient voices that she wants to be discharged on Wednesday and she has it all planned out.  Patient is very excited and speaks very quickly.  Patient is oriented to person and place but not to situation.  Patient gives permission to speak with her daughter Anderson Malta, who she reports is a Catering manager.  Patient does not want to go to the skilled nursing side of [place] for rehab.  RNCM reached out to patient's daughter but it went straight to VM, mailbox is full.   PT is recommending SNF for rehab and patient does eventually during the conversation report that she will go to rehab but only for a very short time.   Seth Bake with [facility] is aware that patient needs STR.  Patient so far only has 1 midnight of inpatient stay, Medicare requires a 3 night stay.  The 3 night waiver is good until in April.   TOC will follow up tomorrow.  SNF workup started.    Expected Discharge Plan: Skilled Nursing Facility Barriers to Discharge: Continued Medical Work up   Patient Goals and CMS Choice Patient states their goals for this hospitalization and ongoing recovery are:: Patient wants to go home and have caregivers stay with her CMS Medicare.gov Compare Post Acute Care list provided to:: Patient Choice offered to / list presented to : Patient  Expected Discharge Plan and Services Expected Discharge Plan: [place]   Discharge Planning Services: CM Consult Post Acute Care Choice: [place] Living  arrangements for the past 2 months: [place]                 DME Arranged: N/A DME Agency: NA                  Prior Living Arrangements/Services Living arrangements for the past 2 months: [place] Lives with:: Self Patient language and need for interpreter reviewed:: Yes Do you feel safe going back to the place where you live?: Yes      Need for Family Participation in Patient Care: Yes (Comment) (intractable nausea and vomiting) Care giver support system in place?: Yes (comment) (daughters) Current home services: DME (has a walker) Criminal Activity/Legal Involvement Pertinent to Current Situation/Hospitalization: No - Comment as needed  Activities of Daily Living Home Assistive Devices/Equipment: None ADL Screening (condition at time of admission) Patient's cognitive ability adequate to safely complete daily activities?: No Is the patient deaf or have difficulty hearing?: No Does the patient have difficulty seeing, even when wearing glasses/contacts?: No Does the patient have difficulty concentrating, remembering, or making decisions?: Yes Patient able to express need for assistance with ADLs?: Yes (standy by assist) Does the patient have difficulty dressing or bathing?: Yes Independently performs ADLs?: No Communication: Independent Dressing (OT): Needs assistance Is this a change from baseline?: Pre-admission baseline Grooming: Needs assistance Is this a change from baseline?: Pre-admission baseline Feeding: Independent Bathing: Needs assistance Is  this a change from baseline?: Pre-admission baseline Toileting: Needs assistance Is this a change from baseline?: Pre-admission baseline In/Out Bed: Needs assistance Is this a change from baseline?: Pre-admission baseline Walks in Home: Needs assistance Is this a change from baseline?: Pre-admission baseline Does the patient have difficulty walking or climbing stairs?: Yes Weakness  of Legs: Both (cannot tolerate standing/walking for long) Weakness of Arms/Hands: Both (does not have good hand dexterity)  Permission Sought/Granted Permission sought to share information with : Case Manager,Family Supports Permission granted to share information with : Yes, Verbal Permission Granted  Share Information with NAME: Anderson Malta  Permission granted to share info w AGENCY: [place] granted to share info w Relationship: daughter     Emotional Assessment Appearance:: Appears stated age Attitude/Demeanor/Rapport: Engaged,Self-Confident Affect (typically observed): Accepting,Euphoric Orientation: : Oriented to Self,Oriented to Place Alcohol / Substance Use: Not Applicable Psych Involvement: No (comment)  Admission diagnosis:  Hallucinations [R44.3] Dehydration [E86.0] AKI (acute kidney injury) ([place]) [N17.9] Intractable vomiting with nausea [R11.2] Acute gastritis, presence of bleeding unspecified, unspecified gastritis type [K29.00] Gastroenteritis [K52.9] Patient Active Problem List   Diagnosis Date Noted  . Intractable vomiting with nausea 12/12/2020  . Gastroenteritis 12/12/2020  . Hypothyroid 12/12/2020  . Prolonged QT interval 12/12/2020  . Peripheral neuropathy 08/01/2019  . Community acquired pneumonia 08/22/2018   PCP:  Baxter Hire, MD Pharmacy:   [facility], [place] [address] Phone: [phone] Fax: [phone]  [facility] [place], [place] [address] Phone: [phone] Fax: [phone]     Social Determinants of Health (SDOH) Interventions    Readmission Risk Interventions No flowsheet data found.

## 2020-12-15 NOTE — Progress Notes (Signed)
PROGRESS NOTE    Jasmine Butler  PYP:[id number] DOB: [birth year] DOA: 12/12/2020 PCP: Baxter Hire, MD   Assessment & Plan:   Principal Problem:   Intractable vomiting with nausea Active Problems:   Peripheral neuropathy   Gastroenteritis   Hypothyroid   Prolonged QT interval    Possible gastroenteritis: vs ADRs of h. pylori treatment. Improving daily. Presented w/ abd pain, N/V & diarrhea. Zofran prn. GI PCR panel & c. diff are both neg. Continue on previously started amoxicillin, clarithromycin, & PPI.   H. pylori: continue on clarithromycin, amoxicillin, PPI   Hypokalemia: WNL today   Hypomagnesemia: WNL today   AKI vs CKD: likely CKD considering pt's age. Cr is trending up from day prior   Hypothyroidism: continue on home dose of levothyroxine   HLD: continue on statin   HTN: continue on atenolol, spironolactone    Prolonged QT: QTc on EKG 12/13/20 <500   Morbid obesity: BMI 33.3. Complicates overall care and prognosis    DVT prophylaxis: lovenox  Code Status: full Family Communication:  Disposition Plan: likely d/c to SNF   Level of care: Med-Surg   Status is: Inpatient  Remains inpatient appropriate because:Unsafe d/c plan   Dispo: The patient is from: Home              Anticipated d/c is to: SNF              Patient currently is medically stable to d/c.   Difficult to place patient No         Consultants:      Procedures:  Antimicrobials: amoxicillin, clarithromycin   Subjective: Pt c/o weakness  Objective: Vitals:   12/14/20 2047 12/15/20 0125 12/15/20 0455 12/15/20 0838  BP: (!) 95/40 (!) 109/56 (!) 94/40 108/82  Pulse: 66 72 76 74  Resp: 16 16 18 15   Temp: 97.6 F (36.4 C) 98.2 F (36.8 C) 98.2 F (36.8 C) (!) 97.4 F (36.3 C)  TempSrc: Oral  Oral Oral  SpO2: 100% 97% 96%   Weight:      Height:        Intake/Output Summary (Last 24 hours) at 12/15/2020 0850 Last data filed at 12/14/2020 2130 Gross per 24 hour   Intake 480 ml  Output --  Net 480 ml   Filed Weights   12/12/20 1325 12/12/20 2300  Weight: 90.7 kg 85.5 kg    Examination:  General exam: Appears comfortable  Respiratory system: clear breath sounds b/l  Cardiovascular system: S1 & S2+. No clicks or rubs  Gastrointestinal system: Abd is soft, NT, ND & normal bowel sounds  Central nervous system: Alert and oriented. Moves all 4 extremities  Psychiatry: Tangential thinking. Judgement and insight appear normal    Data Reviewed: I have personally reviewed following labs and imaging studies  CBC: Recent Labs  Lab 12/12/20 1326 12/13/20 0557 12/14/20 0558 12/15/20 0414  WBC 8.7 8.0 8.8 8.2  HGB 13.3 11.6* 12.0 11.5*  HCT 40.5 34.7* 36.9 34.0*  MCV 96.2 95.9 96.6 94.2  PLT 238 200 205 245   Basic Metabolic Panel: Recent Labs  Lab 12/12/20 1326 12/13/20 0557 12/14/20 0558 12/15/20 0414  NA 137 139 139 139  K 3.6 3.4* 3.4* 4.0  CL 103 111 112* 113*  CO2 24 20* 22 21*  GLUCOSE 90 74 97 116*  BUN 24* 21 23 23   CREATININE 1.73* 1.65* 1.53* 1.55*  CALCIUM 10.3 9.2 9.3 9.2  MG  --   --  1.5* 1.8  GFR: Estimated Creatinine Clearance: 28.5 mL/min (A) (by C-G formula based on SCr of 1.55 mg/dL (H)). Liver Function Tests: Recent Labs  Lab 12/12/20 1326  AST 41  ALT 28  ALKPHOS 97  BILITOT 1.7*  PROT 7.0  ALBUMIN 3.5   Recent Labs  Lab 12/12/20 1326  LIPASE 38   No results for input(s): AMMONIA in the last 168 hours. Coagulation Profile: No results for input(s): INR, PROTIME in the last 168 hours. Cardiac Enzymes: Recent Labs  Lab 12/12/20 1326  CKTOTAL 131   BNP (last 3 results) No results for input(s): PROBNP in the last 8760 hours. HbA1C: No results for input(s): HGBA1C in the last 72 hours. CBG: No results for input(s): GLUCAP in the last 168 hours. Lipid Profile: No results for input(s): CHOL, HDL, LDLCALC, TRIG, CHOLHDL, LDLDIRECT in the last 72 hours. Thyroid Function Tests: No results  for input(s): TSH, T4TOTAL, FREET4, T3FREE, THYROIDAB in the last 72 hours. Anemia Panel: No results for input(s): VITAMINB12, FOLATE, FERRITIN, TIBC, IRON, RETICCTPCT in the last 72 hours. Sepsis Labs: No results for input(s): PROCALCITON, LATICACIDVEN in the last 168 hours.  Recent Results (from the past 240 hour(s))  SARS CORONAVIRUS 2 (TAT 6-24 HRS) Nasopharyngeal Nasopharyngeal Swab     Status: None   Collection Time: 12/12/20  5:51 PM   Specimen: Nasopharyngeal Swab  Result Value Ref Range Status   SARS Coronavirus 2 NEGATIVE NEGATIVE Final    Comment: (NOTE) SARS-CoV-2 target nucleic acids are NOT DETECTED.  The SARS-CoV-2 RNA is generally detectable in upper and lower respiratory specimens during the acute phase of infection. Negative results do not preclude SARS-CoV-2 infection, do not rule out co-infections with other pathogens, and should not be used as the sole basis for treatment or other patient management decisions. Negative results must be combined with clinical observations, patient history, and epidemiological information. The expected result is Negative.  Fact Sheet for Patients: SugarRoll.be  Fact Sheet for Healthcare Providers: https://www.woods-mathews.com/  This test is not yet approved or cleared by the [place] FDA and  has been authorized for detection and/or diagnosis of SARS-CoV-2 by FDA under an Emergency Use Authorization (EUA). This EUA will remain  in effect (meaning this test can be used) for the duration of the COVID-19 declaration under Se ction 564(b)(1) of the Act, 21 U.S.C. section 360bbb-3(b)(1), unless the authorization is terminated or revoked sooner.  Performed at [facility], [place] [address] [place], [place] [address]   Gastrointestinal Panel by PCR , Stool     Status: None   Collection Time: 12/13/20  3:00 PM   Specimen: Stool  Result Value Ref Range Status   Campylobacter  species NOT DETECTED NOT DETECTED Final   Plesimonas shigelloides NOT DETECTED NOT DETECTED Final   Salmonella species NOT DETECTED NOT DETECTED Final   Yersinia enterocolitica NOT DETECTED NOT DETECTED Final   Vibrio species NOT DETECTED NOT DETECTED Final   Vibrio cholerae NOT DETECTED NOT DETECTED Final   Enteroaggregative E coli (EAEC) NOT DETECTED NOT DETECTED Final   Enteropathogenic E coli (EPEC) NOT DETECTED NOT DETECTED Final   Enterotoxigenic E coli (ETEC) NOT DETECTED NOT DETECTED Final   Shiga like toxin producing E coli (STEC) NOT DETECTED NOT DETECTED Final   Shigella/Enteroinvasive E coli (EIEC) NOT DETECTED NOT DETECTED Final   Cryptosporidium NOT DETECTED NOT DETECTED Final   Cyclospora cayetanensis NOT DETECTED NOT DETECTED Final   Entamoeba histolytica NOT DETECTED NOT DETECTED Final   Giardia lamblia NOT  DETECTED NOT DETECTED Final   Adenovirus F40/41 NOT DETECTED NOT DETECTED Final   Astrovirus NOT DETECTED NOT DETECTED Final   Norovirus GI/GII NOT DETECTED NOT DETECTED Final   Rotavirus A NOT DETECTED NOT DETECTED Final   Sapovirus (I, II, IV, and V) NOT DETECTED NOT DETECTED Final    Comment: Performed at [facility], [place], [place] [address]  C Difficile Quick Screen w PCR reflex     Status: None   Collection Time: 12/13/20  3:00 PM   Specimen: Stool  Result Value Ref Range Status   C Diff antigen NEGATIVE NEGATIVE Final   C Diff toxin NEGATIVE NEGATIVE Final   C Diff interpretation No C. difficile detected.  Final    Comment: Performed at [facility], [address], [place],  [address]         Radiology Studies: No results found.      Scheduled Meds: . amoxicillin  1,000 mg Oral BID  . atenolol  25 mg Oral Daily  . atorvastatin  40 mg Oral Daily  . clarithromycin  250 mg Oral BID  . enoxaparin (LOVENOX) injection  30 mg Subcutaneous Q24H  . levothyroxine  100 mcg Oral Daily  . pantoprazole  40 mg  Oral BID  . sucralfate  1 g Oral TID WC & HS   Continuous Infusions:    LOS: 1 day    Time spent: 28 mins     Wyvonnia Dusky, MD [facility] Pager [phone]  If 7PM-7AM, please contact night-coverage 12/15/2020, 8:50 AM

## 2020-12-16 LAB — BASIC METABOLIC PANEL
Anion gap: 5 (ref 5–15)
BUN: 26 mg/dL — ABNORMAL HIGH (ref 8–23)
CO2: 23 mmol/L (ref 22–32)
Calcium: 9.3 mg/dL (ref 8.9–10.3)
Chloride: 112 mmol/L — ABNORMAL HIGH (ref 98–111)
Creatinine, Ser: 1.59 mg/dL — ABNORMAL HIGH (ref 0.44–1.00)
GFR, Estimated: 32 mL/min — ABNORMAL LOW (ref >60)
Glucose, Bld: 112 mg/dL — ABNORMAL HIGH (ref 70–99)
Potassium: 4 mmol/L (ref 3.5–5.1)
Sodium: 140 mmol/L (ref 135–145)

## 2020-12-16 LAB — CBC
HCT: 35.3 % — ABNORMAL LOW (ref 36.0–46.0)
Hemoglobin: 11.5 g/dL — ABNORMAL LOW (ref 12.0–15.0)
MCH: 31.4 pg (ref 26.0–34.0)
MCHC: 32.6 g/dL (ref 30.0–36.0)
MCV: 96.4 fL (ref 80.0–100.0)
Platelets: 214 10*3/uL (ref 150–400)
RBC: 3.66 MIL/uL — ABNORMAL LOW (ref 3.87–5.11)
RDW: 14.1 % (ref 11.5–15.5)
WBC: 8.3 10*3/uL (ref 4.0–10.5)
nRBC: 0 % (ref 0.0–0.2)

## 2020-12-16 LAB — MAGNESIUM: Magnesium: 1.6 mg/dL — ABNORMAL LOW (ref 1.7–2.4)

## 2020-12-16 MED ORDER — MAGNESIUM SULFATE 2 GM/50ML IV SOLN
2.0000 g | Freq: Once | INTRAVENOUS | Status: AC
  Administered 2020-12-16: 10:00:00 2 g via INTRAVENOUS
  Filled 2020-12-16: qty 50

## 2020-12-16 NOTE — Progress Notes (Signed)
Physical Therapy Treatment Patient Details Name: Jasmine Butler MRN: [id number] DOB: 04-27-1935 Today's Date: 12/16/2020    History of Present Illness Pt admitted for intractable vomiting with nausea. History includes neuropathy and H phylori. Restident at [place].    PT Comments    Ready for session.   OOB with no assist.  She is able to stand and walk to bathroom then continue 3 laps around small nursing unit with min guard and occasional cues for safety.  Some imbalances noted but she is able to self correct.  Very talkative and difficult to keep on task at times. Remain in recliner with needs met.  Some SOB noted but sats remained in high 90's with gait today.  Pt [facility] at [facility].  Stated her daughter is flying in tomorrow to stay with her at home for a week upon discharge.  If so, discharge home with HHPT and family support would be appropriate.  Discussed with TOC.   Follow Up Recommendations  SNF - other, see above.     Equipment Recommendations   (TBD)    Recommendations for Other Services       Precautions / Restrictions Precautions Precautions: Fall Restrictions Weight Bearing Restrictions: No    Mobility  Bed Mobility Overal bed mobility: Needs Assistance Bed Mobility: Supine to Sit     Supine to sit: Supervision;HOB elevated          Transfers Overall transfer level: Needs assistance Equipment used: Rolling walker (2 wheeled) Transfers: Sit to/from Stand Sit to Stand: Min guard            Ambulation/Gait Ambulation/Gait assistance: Min guard Gait Distance (Feet): 450 Feet Assistive device: Rolling walker (2 wheeled) Gait Pattern/deviations: Step-through pattern     General Gait Details: sats stable today   Stairs             Wheelchair Mobility    Modified Rankin (Stroke Patients Only)       Balance Overall balance assessment: Needs assistance;History of Falls Sitting-balance support: Bilateral upper extremity  supported;Feet supported Sitting balance-Leahy Scale: Good     Standing balance support: Bilateral upper extremity supported Standing balance-Leahy Scale: Fair                              Cognition Arousal/Alertness: Awake/alert Behavior During Therapy: WFL for tasks assessed/performed Overall Cognitive Status: No family/caregiver present to determine baseline cognitive functioning                                        Exercises      General Comments        Pertinent Vitals/Pain Pain Assessment: No/denies pain    Home Living                      Prior Function            PT Goals (current goals can now be found in the care plan section) Progress towards PT goals: Progressing toward goals    Frequency    Min 2X/week      PT Plan      Co-evaluation              AM-PAC PT "6 Clicks" Mobility   Outcome Measure  Help needed turning from your back to your side while in a flat  bed without using bedrails?: A Little Help needed moving from lying on your back to sitting on the side of a flat bed without using bedrails?: A Little Help needed moving to and from a bed to a chair (including a wheelchair)?: A Little Help needed standing up from a chair using your arms (e.g., wheelchair or bedside chair)?: A Lot Help needed to walk in hospital room?: A Little Help needed climbing 3-5 steps with a railing? : A Lot 6 Click Score: 16    End of Session Equipment Utilized During Treatment: Gait belt Activity Tolerance: Patient limited by pain Patient left: in chair;with chair alarm set (unsure of accuracy of chair alarm, left door open) Nurse Communication: Mobility status PT Visit Diagnosis: Unsteadiness on feet (R26.81);Repeated falls (R29.6);Muscle weakness (generalized) (M62.81);Difficulty in walking, not elsewhere classified (R26.2);Pain Pain - Right/Left:  (abdomen)     Time: 4010-2725 PT Time Calculation (min) (ACUTE  ONLY): 14 min  Charges:  $Gait Training: 8-22 mins                    Chesley Noon, PTA 12/16/20, 12:27 PM

## 2020-12-16 NOTE — TOC Progression Note (Signed)
Transition of Care [facility]) - Progression Note    Patient Details  Name: Jasmine Butler MRN: [id number] Date of Birth: 1934/11/01  Transition of Care [facility]) CM/SW [place], RN Phone Number: 12/16/2020, 12:33 PM  Clinical Narrative:     Spoke to the patient and to her daughter Jasmine Butler The patient lives at [facility] in the apartments Her daughter Jasmine Butler is coming to stay with her for a while to assist The patient has a RW at home and will not need additional DME, The CM set the patient up with [facility] and confirmed with Corene Cornea CM notified [place] and left a VM with Seth Bake that the patient will need transportation back to [place] a call back  Expected Discharge Plan: [place] Barriers to Discharge: Continued Medical Work up  Expected Discharge Plan and Services Expected Discharge Plan: [place]   Discharge Planning Services: CM Consult Post Acute Care Choice: [place] Living arrangements for the past 2 months: [place]                 DME Arranged: N/A DME Agency: NA                   Social Determinants of Health (SDOH) Interventions    Readmission Risk Interventions No flowsheet data found.

## 2020-12-16 NOTE — TOC Progression Note (Signed)
Transition of Care [facility]) - CM/SW Discharge Note   Patient Details  Name: Caran Storck MRN: [id number] Date of Birth: 07-09-1935  Transition of Care [facility]) CM/SW Contact:  Su Hilt, RN Phone Number: 12/16/2020, 12:49 PM   Clinical Narrative:    Damaris Schooner with Seth Bake at [facility] She stated that they will provide transportation for the patient to get home tomorrow She provided the number to their transportation department, CM called Transportation at [phone] and left a VM for Richardson Landry to call back to arrange transportation for Wednesday Enrique Sack the patient's daughter to notify that Transportation will be set up with [facility] for Wednesday   Final next level of care: Home w Home Health Services Barriers to Discharge: Continued Medical Work up   Patient Goals and CMS Choice Patient states their goals for this hospitalization and ongoing recovery are:: Patient wants to go home and have caregivers stay with her CMS Medicare.gov Compare Post Acute Care list provided to:: Patient Choice offered to / list presented to : Patient  Discharge Placement                       Discharge Plan and Services   Discharge Planning Services: CM Consult Post Acute Care Choice: [place]          DME Arranged: N/A DME Agency: NA                  Social Determinants of Health (SDOH) Interventions     Readmission Risk Interventions No flowsheet data found.

## 2020-12-16 NOTE — Progress Notes (Addendum)
PROGRESS NOTE   HPI was taken from Dr. Rexene Alberts is a 86 y.o. female with medical history significant for hypothyroid, peripheral neuropathy, H pylori, presents emergency department for chief concerns of nausea, vomiting, diarrhea.  She reports diarrhea started 12/11/20.  This prompted her to present to the emergency department for further.  She denies bright red blood per rectum, black stool.  She endorses vomiting and denies nausea, that started 4 days ago.  She denies hematemesis, hemoptysis, coffee-ground emesis.  Vaccinated: two shots and booster for covid  Social history: lives by herself.  She denies tobacco use, EtOH, recreational drug use.  Hospital course from Dr. Williams3/12-3/15/33: Pt presented w/ abd pain, nausea, vomiting and diarrhea possibly secondary gastroenteritis vs ADRs of triple therapy for h. Pylori. Last day of triple therapy is 12/19/20. C. diff and GI PCR panel were both neg. PT/OT recs SNF. Pt lives at [facility]. Pt has tangential thinking   Jasmine Butler  TMH:[id number] DOB: Apr 04, 1935 DOA: 12/12/2020 PCP: Baxter Hire, MD   Assessment & Plan:   Principal Problem:   Intractable vomiting with nausea Active Problems:   Peripheral neuropathy   Gastroenteritis   Hypothyroid   Prolonged QT interval    Possible gastroenteritis: vs ADRs of h. pylori treatment. Improving daily. Presented w/ abd pain, N/V & diarrhea. Zofran prn. GI PCR panel & c. diff are both neg. Continue on previously started amoxicillin, clarithromycin, & PPI.   H. Pylori: continue on amoxicillin, clarithromycin, PPI until 12/19/20  Hypokalemia: within normal limits   Hypomagnesemia: mg sulfate ordered. Will continue to monitor   AKI vs CKD: likely CKD considering pt's age. Cr continues to trend up. Consider holding spironolactone if Cr continues to rise. Avoid nephrotoxic meds  Hypothyroidism: continue on home dose of levothyroxine   HLD: continue on statin   HTN: continue  on spironolactone, atenolol    Prolonged QT: QTc on EKG 12/13/20 <500   Morbid obesity: BMI 33.3. Complicates overall care & prognosis      DVT prophylaxis: lovenox  Code Status: full Family Communication:  Disposition Plan: likely d/c to SNF. Pt initially agreed to SNF but now is refusing SNF. Will go home w/ HH tomorrow at 9:30AM as pt's daughter is coming in tomorrow   Level of care: Med-Surg   Status is: Inpatient  Remains inpatient appropriate because:Unsafe d/c plan,see above    Dispo: The patient is from: Home              Anticipated d/c is to: SNF              Patient currently is medically stable to d/c.   Difficult to place patient No         Consultants:      Procedures:  Antimicrobials: amoxicillin, clarithromycin   Subjective: Pt c/o diarrhea   Objective: Vitals:   12/15/20 1729 12/15/20 2045 12/15/20 2342 12/16/20 0351  BP: (!) 98/52 (!) 106/46 122/65 (!) 110/48  Pulse: 61 72 78 74  Resp: 17 16 17 17   Temp: 97.7 F (36.5 C) (!) 97.5 F (36.4 C) (!) 97.5 F (36.4 C) 97.7 F (36.5 C)  TempSrc:  Oral Oral Oral  SpO2: 99% 98% 100% 98%  Weight:      Height:        Intake/Output Summary (Last 24 hours) at 12/16/2020 0732 Last data filed at 12/15/2020 1853 Gross per 24 hour  Intake 120 ml  Output --  Net 120 ml   Danley Danker  Weights   12/12/20 1325 12/12/20 2300  Weight: 90.7 kg 85.5 kg    Examination:  General exam:  Appears anxious   Respiratory system: clear breath sounds b/l  Cardiovascular system: Abd is soft, obese, & hyperactive bowel sounds  Central nervous system: Alert and oriented. Moves all extremities  Psychiatry: Tangential thinking. Judgement and insight appear normal    Data Reviewed: I have personally reviewed following labs and imaging studies  CBC: Recent Labs  Lab 12/12/20 1326 12/13/20 0557 12/14/20 0558 12/15/20 0414 12/16/20 0410  WBC 8.7 8.0 8.8 8.2 8.3  HGB 13.3 11.6* 12.0 11.5* 11.5*  HCT 40.5  34.7* 36.9 34.0* 35.3*  MCV 96.2 95.9 96.6 94.2 96.4  PLT 238 200 205 181 101   Basic Metabolic Panel: Recent Labs  Lab 12/12/20 1326 12/13/20 0557 12/14/20 0558 12/15/20 0414 12/16/20 0410  NA 137 139 139 139 140  K 3.6 3.4* 3.4* 4.0 4.0  CL 103 111 112* 113* 112*  CO2 24 20* 22 21* 23  GLUCOSE 90 74 97 116* 112*  BUN 24* 21 23 23  26*  CREATININE 1.73* 1.65* 1.53* 1.55* 1.59*  CALCIUM 10.3 9.2 9.3 9.2 9.3  MG  --   --  1.5* 1.8 1.6*   GFR: Estimated Creatinine Clearance: 27.8 mL/min (A) (by C-G formula based on SCr of 1.59 mg/dL (H)). Liver Function Tests: Recent Labs  Lab 12/12/20 1326  AST 41  ALT 28  ALKPHOS 97  BILITOT 1.7*  PROT 7.0  ALBUMIN 3.5   Recent Labs  Lab 12/12/20 1326  LIPASE 38   No results for input(s): AMMONIA in the last 168 hours. Coagulation Profile: No results for input(s): INR, PROTIME in the last 168 hours. Cardiac Enzymes: Recent Labs  Lab 12/12/20 1326  CKTOTAL 131   BNP (last 3 results) No results for input(s): PROBNP in the last 8760 hours. HbA1C: No results for input(s): HGBA1C in the last 72 hours. CBG: No results for input(s): GLUCAP in the last 168 hours. Lipid Profile: No results for input(s): CHOL, HDL, LDLCALC, TRIG, CHOLHDL, LDLDIRECT in the last 72 hours. Thyroid Function Tests: No results for input(s): TSH, T4TOTAL, FREET4, T3FREE, THYROIDAB in the last 72 hours. Anemia Panel: No results for input(s): VITAMINB12, FOLATE, FERRITIN, TIBC, IRON, RETICCTPCT in the last 72 hours. Sepsis Labs: No results for input(s): PROCALCITON, LATICACIDVEN in the last 168 hours.  Recent Results (from the past 240 hour(s))  SARS CORONAVIRUS 2 (TAT 6-24 HRS) Nasopharyngeal Nasopharyngeal Swab     Status: None   Collection Time: 12/12/20  5:51 PM   Specimen: Nasopharyngeal Swab  Result Value Ref Range Status   SARS Coronavirus 2 NEGATIVE NEGATIVE Final    Comment: (NOTE) SARS-CoV-2 target nucleic acids are NOT DETECTED.  The  SARS-CoV-2 RNA is generally detectable in upper and lower respiratory specimens during the acute phase of infection. Negative results do not preclude SARS-CoV-2 infection, do not rule out co-infections with other pathogens, and should not be used as the sole basis for treatment or other patient management decisions. Negative results must be combined with clinical observations, patient history, and epidemiological information. The expected result is Negative.  Fact Sheet for Patients: SugarRoll.be  Fact Sheet for Healthcare Providers: https://www.woods-mathews.com/  This test is not yet approved or cleared by the [place] FDA and  has been authorized for detection and/or diagnosis of SARS-CoV-2 by FDA under an Emergency Use Authorization (EUA). This EUA will remain  in effect (meaning this test can be used) for  the duration of the COVID-19 declaration under Se ction 564(b)(1) of the Act, 21 U.S.C. section 360bbb-3(b)(1), unless the authorization is terminated or revoked sooner.  Performed at [facility], [place] [address] [place], [place] [address]   Gastrointestinal Panel by PCR , Stool     Status: None   Collection Time: 12/13/20  3:00 PM   Specimen: Stool  Result Value Ref Range Status   Campylobacter species NOT DETECTED NOT DETECTED Final   Plesimonas shigelloides NOT DETECTED NOT DETECTED Final   Salmonella species NOT DETECTED NOT DETECTED Final   Yersinia enterocolitica NOT DETECTED NOT DETECTED Final   Vibrio species NOT DETECTED NOT DETECTED Final   Vibrio cholerae NOT DETECTED NOT DETECTED Final   Enteroaggregative E coli (EAEC) NOT DETECTED NOT DETECTED Final   Enteropathogenic E coli (EPEC) NOT DETECTED NOT DETECTED Final   Enterotoxigenic E coli (ETEC) NOT DETECTED NOT DETECTED Final   Shiga like toxin producing E coli (STEC) NOT DETECTED NOT DETECTED Final   Shigella/Enteroinvasive E coli (EIEC) NOT DETECTED NOT  DETECTED Final   Cryptosporidium NOT DETECTED NOT DETECTED Final   Cyclospora cayetanensis NOT DETECTED NOT DETECTED Final   Entamoeba histolytica NOT DETECTED NOT DETECTED Final   Giardia lamblia NOT DETECTED NOT DETECTED Final   Adenovirus F40/41 NOT DETECTED NOT DETECTED Final   Astrovirus NOT DETECTED NOT DETECTED Final   Norovirus GI/GII NOT DETECTED NOT DETECTED Final   Rotavirus A NOT DETECTED NOT DETECTED Final   Sapovirus (I, II, IV, and V) NOT DETECTED NOT DETECTED Final    Comment: Performed at [facility], [address] [place], [place] [address]  C Difficile Quick Screen w PCR reflex     Status: None   Collection Time: 12/13/20  3:00 PM   Specimen: Stool  Result Value Ref Range Status   C Diff antigen NEGATIVE NEGATIVE Final   C Diff toxin NEGATIVE NEGATIVE Final   C Diff interpretation No C. difficile detected.  Final    Comment: Performed at [facility], [address], [place], [place] [address]         Radiology Studies: No results found.      Scheduled Meds: . amoxicillin  1,000 mg Oral BID  . atenolol  25 mg Oral Daily  . atorvastatin  40 mg Oral Daily  . clarithromycin  250 mg Oral BID  . enoxaparin (LOVENOX) injection  30 mg Subcutaneous Q24H  . levothyroxine  100 mcg Oral Daily  . pantoprazole  40 mg Oral BID  . spironolactone  25 mg Oral Daily  . sucralfate  1 g Oral TID WC & HS   Continuous Infusions: . magnesium sulfate bolus IVPB       LOS: 2 days    Time spent: 33 mins     Wyvonnia Dusky, MD [facility] Pager [phone]  If 7PM-7AM, please contact night-coverage 12/16/2020, 7:32 AM

## 2020-12-17 DIAGNOSIS — N179 Acute kidney failure, unspecified: Secondary | ICD-10-CM

## 2020-12-17 DIAGNOSIS — A048 Other specified bacterial intestinal infections: Secondary | ICD-10-CM

## 2020-12-17 DIAGNOSIS — E039 Hypothyroidism, unspecified: Secondary | ICD-10-CM

## 2020-12-17 DIAGNOSIS — E876 Hypokalemia: Secondary | ICD-10-CM

## 2020-12-17 DIAGNOSIS — K529 Noninfective gastroenteritis and colitis, unspecified: Principal | ICD-10-CM

## 2020-12-17 DIAGNOSIS — N189 Chronic kidney disease, unspecified: Secondary | ICD-10-CM

## 2020-12-17 LAB — BASIC METABOLIC PANEL
Anion gap: 7 (ref 5–15)
BUN: 25 mg/dL — ABNORMAL HIGH (ref 8–23)
CO2: 26 mmol/L (ref 22–32)
Calcium: 9.8 mg/dL (ref 8.9–10.3)
Chloride: 108 mmol/L (ref 98–111)
Creatinine, Ser: 1.31 mg/dL — ABNORMAL HIGH (ref 0.44–1.00)
GFR, Estimated: 40 mL/min — ABNORMAL LOW (ref >60)
Glucose, Bld: 90 mg/dL (ref 70–99)
Potassium: 3.7 mmol/L (ref 3.5–5.1)
Sodium: 141 mmol/L (ref 135–145)

## 2020-12-17 LAB — CBC
HCT: 39.3 % (ref 36.0–46.0)
Hemoglobin: 12.8 g/dL (ref 12.0–15.0)
MCH: 31.6 pg (ref 26.0–34.0)
MCHC: 32.6 g/dL (ref 30.0–36.0)
MCV: 97 fL (ref 80.0–100.0)
Platelets: 219 10*3/uL (ref 150–400)
RBC: 4.05 MIL/uL (ref 3.87–5.11)
RDW: 14.1 % (ref 11.5–15.5)
WBC: 8.2 10*3/uL (ref 4.0–10.5)
nRBC: 0 % (ref 0.0–0.2)

## 2020-12-17 LAB — MAGNESIUM: Magnesium: 1.8 mg/dL (ref 1.7–2.4)

## 2020-12-17 MED ORDER — ENOXAPARIN SODIUM 40 MG/0.4ML ~~LOC~~ SOLN: 40.0000 mg | SUBCUTANEOUS | Status: DC

## 2020-12-17 NOTE — Care Management Important Message (Signed)
Important Message  Patient Details  Name: Jasmine Butler MRN: [id number] Date of Birth: [birth year]   Medicare Important Message Given:  Yes     [name] 12/17/2020, 10:05 AM

## 2020-12-17 NOTE — Discharge Instructions (Signed)
Stop amoxicillin and biaxin afte  Acute Kidney Injury, Adult  Acute kidney injury is a sudden worsening of kidney function. The kidneys are organs that have several jobs. They filter the blood to remove waste products and extra fluid. They also maintain a healthy balance of minerals and hormones in the body, which helps control blood pressure and keep bones strong. With this condition, your kidneys do not do their jobs as well as they should. This condition ranges from mild to severe. Over time, it may develop into long-lasting (chronic) kidney disease. Early detection and treatment may prevent acute kidney injury from developing into a chronic condition. What are the causes? Common causes of this condition include:  A problem with blood flow to the kidneys. This may be caused by: ? Low blood pressure (hypotension) or shock. ? Blood loss. ? Heart and blood vessel (cardiovascular) disease. ? Severe burns. ? Liver disease.  Direct damage to the kidneys. This may be caused by: ? Certain medicines. ? A kidney infection. ? Poisoning. ? Being around or in contact with toxic substances. ? A surgical wound. ? A hard, direct hit to the kidney area.  A sudden blockage of urine flow. This may be caused by: ? Cancer. ? Kidney stones. ? An enlarged prostate in males. What increases the risk? You are more likely to develop this condition if you:  Are older than age 34.  Are female.  Are hospitalized, especially if you are in critical condition.  Have certain conditions, such as: ? Chronic kidney disease. ? Diabetes. ? Coronary artery disease and heart failure. ? Pulmonary disease. ? Chronic liver disease. What are the signs or symptoms? Symptoms of this condition may not be obvious until the condition becomes severe. Symptoms of this condition can include:  Tiredness (lethargy) or difficulty staying awake.  Nausea or vomiting.  Swelling (edema) of the face, legs, ankles, or  feet.  Problems with urination, such as: ? Pain in the abdomen, or pain along the side of your stomach (flank). ? Producing little or no urine. ? Passing urine with a weak flow.  Muscle twitches and cramps, especially in the legs.  Confusion or trouble concentrating.  Loss of appetite.  Fever. How is this diagnosed? Your health care provider can diagnose this condition based on your symptoms, medical history, and a physical exam.  You may also have other tests, such as:  Blood tests.  Urine tests.  Imaging tests.  A test in which a sample of tissue is removed from the kidneys to be examined under a microscope (kidney biopsy). How is this treated? Treatment for this condition depends on the cause and how severe the condition is. In mild cases, treatment may not be needed. The kidneys may heal on their own. In more severe cases, treatment will involve:  Treating the cause of the kidney injury. This may involve changing any medicines you are taking or adjusting your dosage.  Fluids. You may need specialized IV fluids to balance your body's needs.  Having a catheter placed to drain urine and prevent blockages.  Preventing problems from occurring. This may mean avoiding certain medicines or procedures that can cause further injury to the kidneys. In some cases, treatment may also require:  A procedure to remove toxic wastes from the body (dialysis or continuous renal replacement therapy, CRRT).  Surgery. This may be done to repair a torn kidney or to remove the blockage from the urinary system. Follow these instructions at home: Medicines  Take  over-the-counter and prescription medicines only as told by your health care provider.  Do not take any new medicines without your health care provider's approval. Many medicines can worsen your kidney damage.  Do not take any vitamin and mineral supplements without your health care provider's approval. Many nutritional supplements  can worsen your kidney damage. Lifestyle  If your health care provider prescribed changes to your diet, follow them. You may need to decrease the amount of protein you eat.  Achieve and maintain a healthy weight. If you need help with this, ask your health care provider.  Start or continue an exercise plan. Try to exercise at least 30 minutes a day, 5 days a week.  Do not use any products that contain nicotine or tobacco, such as cigarettes, e-cigarettes, and chewing tobacco. If you need help quitting, ask your health care provider.   General instructions  Keep track of your blood pressure. Report changes in your blood pressure as told by your health care provider.  Stay up to date with your vaccines. Ask your health care provider which vaccines you need.  Keep all follow-up visits as told by your health care provider. This is important.   Where to find more information  American Association of Kidney Patients: BombTimer.gl  National Kidney Foundation: www.kidney.[place]: https://mathis.com/  Life Options Rehabilitation Program: ? www.lifeoptions.org ? www.kidneyschool.org Contact a health care provider if:  Your symptoms get worse.  You develop new symptoms. Get help right away if:  You develop symptoms of worsening kidney disease, which include: ? Headaches. ? Abnormally dark or light skin. ? Easy bruising. ? Frequent hiccups. ? Chest pain. ? Shortness of breath. ? End of menstruation in women. ? Seizures. ? Confusion or altered mental status. ? Abdominal or back pain. ? Itchiness.  You have a fever.  Your body is producing less urine.  You have pain or bleeding when you urinate. Summary  Acute kidney injury is a sudden worsening of kidney function.  Acute kidney injury can be caused by problems with blood flow to the kidneys, direct damage to the kidneys, and sudden blockage of urine flow.  Symptoms of this condition may not be obvious until  it becomes severe. Symptoms may include edema, lethargy, confusion, nausea or vomiting, and problems passing urine.  This condition can be diagnosed with blood tests, urine tests, and imaging tests. Sometimes a kidney biopsy is done to diagnose this condition.  Treatment for this condition often involves treating the underlying cause. It is treated with fluids, medicines, diet changes, dialysis, or surgery. This information is not intended to replace advice given to you by your health care provider. Make sure you discuss any questions you have with your health care provider. Document Revised: 07/31/2019 Document Reviewed: 07/31/2019 Elsevier Patient Education  [place]. r 12/19/20 doses
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
## 2020-12-17 NOTE — Progress Notes (Signed)
Anticoagulation monitoring(Lovenox):  85yo  F ordered Lovenox 30 mg Q24h  Filed Weights   12/12/20 1325 12/12/20 2300  Weight: 90.7 kg (200 lb) 85.5 kg (188 lb 7.9 oz)   BMI 33.9   Lab Results  Component Value Date   CREATININE 1.31 (H) 12/17/2020   CREATININE 1.59 (H) 12/16/2020   CREATININE 1.55 (H) 12/15/2020   Estimated Creatinine Clearance: 33.7 mL/min (A) (by C-G formula based on SCr of 1.31 mg/dL (H)). Hemoglobin & Hematocrit     Component Value Date/Time   HGB 12.8 12/17/2020 0451   HCT 39.3 12/17/2020 0451     Per Protocol for Patient with estCrcl now > 30 ml/min and BMI > 30, will transition to Lovenox 40 mg Q24h     Chinita [place] PharmD Clinical Pharmacist 12/17/2020

## 2021-09-11 DIAGNOSIS — R600 Localized edema: Secondary | ICD-10-CM | POA: Insufficient documentation

## 2022-06-11 ENCOUNTER — Other Ambulatory Visit: Payer: Self-pay

## 2022-06-11 ENCOUNTER — Emergency Department: Payer: Medicare Other

## 2022-06-11 ENCOUNTER — Emergency Department
Admission: EM | Admit: 2022-06-11 | Discharge: 2022-06-11 | Disposition: A | Discharge status: Home / Self Care | Payer: Medicare Other | Attending: Emergency Medicine | Admitting: Emergency Medicine

## 2022-06-11 DIAGNOSIS — R202 Paresthesia of skin: Secondary | ICD-10-CM | POA: Insufficient documentation

## 2022-06-11 DIAGNOSIS — R42 Dizziness and giddiness: Secondary | ICD-10-CM | POA: Diagnosis present

## 2022-06-11 LAB — URINALYSIS, ROUTINE W REFLEX MICROSCOPIC
Bacteria, UA: NONE SEEN
Bilirubin Urine: NEGATIVE
Glucose, UA: NEGATIVE mg/dL
Ketones, ur: NEGATIVE mg/dL
Nitrite: NEGATIVE
Protein, ur: NEGATIVE mg/dL
Specific Gravity, Urine: 1.008 (ref 1.005–1.030)
pH: 6 (ref 5.0–8.0)

## 2022-06-11 LAB — BASIC METABOLIC PANEL
Anion gap: 6 (ref 5–15)
BUN: 25 mg/dL — ABNORMAL HIGH (ref 8–23)
CO2: 27 mmol/L (ref 22–32)
Calcium: 9.7 mg/dL (ref 8.9–10.3)
Chloride: 108 mmol/L (ref 98–111)
Creatinine, Ser: 1.17 mg/dL — ABNORMAL HIGH (ref 0.44–1.00)
GFR, Estimated: 46 mL/min — ABNORMAL LOW (ref >60)
Glucose, Bld: 99 mg/dL (ref 70–99)
Potassium: 4.1 mmol/L (ref 3.5–5.1)
Sodium: 141 mmol/L (ref 135–145)

## 2022-06-11 LAB — CBC
HCT: 40.8 % (ref 36.0–46.0)
Hemoglobin: 13 g/dL (ref 12.0–15.0)
MCH: 31.3 pg (ref 26.0–34.0)
MCHC: 31.9 g/dL (ref 30.0–36.0)
MCV: 98.3 fL (ref 80.0–100.0)
Platelets: 243 10*3/uL (ref 150–400)
RBC: 4.15 MIL/uL (ref 3.87–5.11)
RDW: 14.3 % (ref 11.5–15.5)
WBC: 7.2 10*3/uL (ref 4.0–10.5)
nRBC: 0 % (ref 0.0–0.2)

## 2022-06-11 LAB — TROPONIN I (HIGH SENSITIVITY): Troponin I (High Sensitivity): 7 ng/L (ref <18)

## 2022-06-11 NOTE — ED Provider Notes (Signed)
[facility] Provider Note    Event Date/Time   First MD Initiated Contact with Patient 06/11/22 1503     (approximate)  History   Chief Complaint: Dizziness  HPI  Jasmine Butler is a 87 y.o. female with a past medical history of hyperlipidemia, peripheral neuropathy, presents to the emergency department with a symptom of lightheadedness.  According to the patient since awakening this morning she has had a sensation of feeling very light and tingly throughout her whole body.  Patient best describes as feeling as if she is going to fly or float away.  Denies feeling off balance denies any focal weakness or numbness.  Patient does state a sensation of tingling throughout her body this morning although states that has gone away now.  No recent fever cough congestion nausea vomiting or diarrhea, no dysuria.  Denies any chest pain or shortness of breath.  Physical Exam   Triage Vital Signs: ED Triage Vitals [06/11/22 1230]  Enc Vitals Group     BP 110/76     Pulse Rate 60     Resp 18     Temp 98.2 F (36.8 C)     Temp Source Oral     SpO2 98 %     Weight 185 lb (83.9 kg)     Height      Head Circumference      Peak Flow      Pain Score 0     Pain Loc      Pain Edu?      Excl. in [place]?     Most recent vital signs: Vitals:   06/11/22 1230  BP: 110/76  Pulse: 60  Resp: 18  Temp: 98.2 F (36.8 C)  SpO2: 98%    General: Awake, no distress.  CV:  Good peripheral perfusion.  Regular rate and rhythm  Resp:  Normal effort.  Equal breath sounds bilaterally.  Abd:  No distention.  Soft, nontender.  No rebound or guarding. Other:  Equal grip strength bilaterally.  No pronator drift.  5/5 motor in all extremities.  No cranial nerve deficits.   ED Results / Procedures / Treatments   EKG  EKG viewed and interpreted by myself shows a sinus rhythm at 62 bpm with a slightly widened QRS, left axis deviation, largely normal intervals with nonspecific ST  changes  RADIOLOGY  MRI of the brain is negative for acute abnormality.   MEDICATIONS ORDERED IN ED: Medications - No data to display   IMPRESSION / MDM / [place] / ED COURSE  I reviewed the triage vital signs and the nursing notes.  Patient's presentation is most consistent with acute presentation with potential threat to life or bodily function.  Patient presents emergency department for lightheadedness/paresthesias throughout her body starting this morning.  Patient states she has never had a sensation like this previously.  Denies any new exposures new medications, etc.  Denies any infectious symptoms, largely negative review of systems.  Patient's lab work is reassuring including a normal chemistry creatinine improved from baseline, CBC is normal, troponin is negative.  We will proceed with a urinalysis as well as an MRI of the brain to rule out CVA.  I believe that the patient's MRI is negative and urine is clear patient could be discharged home with outpatient follow-up.  Patient is agreeable to plan of care.  Patient appears very well currently.  MRI is negative for CVA.  Urinalysis does not appear to show any  concerning finding.  Given the patient's reassuring work-up and is the patient appears quite well we will discharge patient with PCP follow-up.  Patient states she will see her doctor on Monday anyways.  FINAL CLINICAL IMPRESSION(S) / ED DIAGNOSES   Paresthesias Lightheadedness    Note:  This document was prepared using Dragon voice recognition software and may include unintentional dictation errors.   Harvest Dark, MD 06/11/22 [phone]

## 2022-06-11 NOTE — ED Notes (Signed)
Pt given discharge instructions and calling transport service at [facility].

## 2022-06-11 NOTE — ED Notes (Signed)
Discharge instructions reviewed with patient. Patient questions answered and opportunity for education reviewed. Patient voices understanding of discharge instructions with no further questions. Patient ambulatory with rollator and steady gait to lobby.

## 2022-06-11 NOTE — ED Triage Notes (Signed)
Pt arrives with c/o dizziness and tingling sensation all over her body. Pt denies focal weakness, gait disturbances, or issues talking/swallowing. Pt is a resident from [facility]. Pt ambulatory in triage with walker.

## 2022-08-31 ENCOUNTER — Emergency Department: Payer: Medicare Other

## 2022-08-31 ENCOUNTER — Other Ambulatory Visit: Payer: Self-pay

## 2022-08-31 ENCOUNTER — Inpatient Hospital Stay: Payer: Medicare Other

## 2022-08-31 ENCOUNTER — Encounter: Payer: Self-pay | Admitting: Emergency Medicine

## 2022-08-31 ENCOUNTER — Inpatient Hospital Stay
Admission: EM | Admit: 2022-08-31 | Discharge: 2022-09-06 | DRG: 689 | Disposition: A | Discharge status: Skilled Nursing Facility | Payer: Medicare Other | Source: Skilled Nursing Facility | Attending: Internal Medicine | Admitting: Internal Medicine

## 2022-08-31 DIAGNOSIS — N390 Urinary tract infection, site not specified: Principal | ICD-10-CM | POA: Diagnosis present

## 2022-08-31 DIAGNOSIS — E785 Hyperlipidemia, unspecified: Secondary | ICD-10-CM | POA: Diagnosis present

## 2022-08-31 DIAGNOSIS — R4182 Altered mental status, unspecified: Secondary | ICD-10-CM | POA: Diagnosis present

## 2022-08-31 DIAGNOSIS — Z881 Allergy status to other antibiotic agents status: Secondary | ICD-10-CM

## 2022-08-31 DIAGNOSIS — E860 Dehydration: Secondary | ICD-10-CM | POA: Diagnosis present

## 2022-08-31 DIAGNOSIS — E875 Hyperkalemia: Secondary | ICD-10-CM | POA: Diagnosis not present

## 2022-08-31 DIAGNOSIS — R319 Hematuria, unspecified: Secondary | ICD-10-CM | POA: Diagnosis present

## 2022-08-31 DIAGNOSIS — R9431 Abnormal electrocardiogram [ECG] [EKG]: Secondary | ICD-10-CM | POA: Diagnosis present

## 2022-08-31 DIAGNOSIS — J219 Acute bronchiolitis, unspecified: Secondary | ICD-10-CM | POA: Diagnosis present

## 2022-08-31 DIAGNOSIS — G629 Polyneuropathy, unspecified: Secondary | ICD-10-CM | POA: Diagnosis present

## 2022-08-31 DIAGNOSIS — Z90710 Acquired absence of both cervix and uterus: Secondary | ICD-10-CM

## 2022-08-31 DIAGNOSIS — R410 Disorientation, unspecified: Principal | ICD-10-CM

## 2022-08-31 DIAGNOSIS — I4891 Unspecified atrial fibrillation: Secondary | ICD-10-CM | POA: Diagnosis not present

## 2022-08-31 DIAGNOSIS — Z1611 Resistance to penicillins: Secondary | ICD-10-CM | POA: Diagnosis present

## 2022-08-31 DIAGNOSIS — G9341 Metabolic encephalopathy: Secondary | ICD-10-CM | POA: Diagnosis present

## 2022-08-31 DIAGNOSIS — Z79890 Hormone replacement therapy (postmenopausal): Secondary | ICD-10-CM | POA: Diagnosis not present

## 2022-08-31 DIAGNOSIS — E039 Hypothyroidism, unspecified: Secondary | ICD-10-CM | POA: Diagnosis present

## 2022-08-31 DIAGNOSIS — E876 Hypokalemia: Secondary | ICD-10-CM | POA: Diagnosis present

## 2022-08-31 DIAGNOSIS — B964 Proteus (mirabilis) (morganii) as the cause of diseases classified elsewhere: Secondary | ICD-10-CM | POA: Diagnosis present

## 2022-08-31 LAB — COMPREHENSIVE METABOLIC PANEL
ALT: 25 U/L (ref 0–44)
AST: 38 U/L (ref 15–41)
Albumin: 3.3 g/dL — ABNORMAL LOW (ref 3.5–5.0)
Alkaline Phosphatase: 110 U/L (ref 38–126)
Anion gap: 6 (ref 5–15)
BUN: 19 mg/dL (ref 8–23)
CO2: 26 mmol/L (ref 22–32)
Calcium: 9.6 mg/dL (ref 8.9–10.3)
Chloride: 107 mmol/L (ref 98–111)
Creatinine, Ser: 0.94 mg/dL (ref 0.44–1.00)
GFR, Estimated: 60 mL/min — ABNORMAL LOW (ref >60)
Glucose, Bld: 138 mg/dL — ABNORMAL HIGH (ref 70–99)
Potassium: 3.2 mmol/L — ABNORMAL LOW (ref 3.5–5.1)
Sodium: 139 mmol/L (ref 135–145)
Total Bilirubin: 1.5 mg/dL — ABNORMAL HIGH (ref 0.3–1.2)
Total Protein: 6.9 g/dL (ref 6.5–8.1)

## 2022-08-31 LAB — TSH: TSH: 6.765 u[IU]/mL — ABNORMAL HIGH (ref 0.350–4.500)

## 2022-08-31 LAB — URINALYSIS, ROUTINE W REFLEX MICROSCOPIC
Bacteria, UA: NONE SEEN
Bilirubin Urine: NEGATIVE
Glucose, UA: NEGATIVE mg/dL
Ketones, ur: NEGATIVE mg/dL
Nitrite: NEGATIVE
Protein, ur: NEGATIVE mg/dL
Specific Gravity, Urine: 1.016 (ref 1.005–1.030)
WBC, UA: >50 WBC/hpf — ABNORMAL HIGH (ref 0–5)
pH: 6 (ref 5.0–8.0)

## 2022-08-31 LAB — CBC
HCT: 41.7 % (ref 36.0–46.0)
Hemoglobin: 13.9 g/dL (ref 12.0–15.0)
MCH: 32 pg (ref 26.0–34.0)
MCHC: 33.3 g/dL (ref 30.0–36.0)
MCV: 96.1 fL (ref 80.0–100.0)
Platelets: 246 10*3/uL (ref 150–400)
RBC: 4.34 MIL/uL (ref 3.87–5.11)
RDW: 13.8 % (ref 11.5–15.5)
WBC: 7.2 10*3/uL (ref 4.0–10.5)
nRBC: 0 % (ref 0.0–0.2)

## 2022-08-31 LAB — MAGNESIUM: Magnesium: 1.7 mg/dL (ref 1.7–2.4)

## 2022-08-31 LAB — TROPONIN I (HIGH SENSITIVITY)
Troponin I (High Sensitivity): 39 ng/L — ABNORMAL HIGH (ref <18)
Troponin I (High Sensitivity): 46 ng/L — ABNORMAL HIGH (ref <18)

## 2022-08-31 LAB — T4, FREE: Free T4: 0.76 ng/dL (ref 0.61–1.12)

## 2022-08-31 LAB — AMMONIA: Ammonia: <10 umol/L (ref 9–35)

## 2022-08-31 LAB — D-DIMER, QUANTITATIVE: D-Dimer, Quant: 0.72 ug/mL-FEU — ABNORMAL HIGH (ref 0.00–0.50)

## 2022-08-31 LAB — BRAIN NATRIURETIC PEPTIDE: B Natriuretic Peptide: 193.9 pg/mL — ABNORMAL HIGH (ref 0.0–100.0)

## 2022-08-31 MED ORDER — MORPHINE SULFATE (PF) 2 MG/ML IV SOLN
2.0000 mg | Freq: Once | INTRAVENOUS | Status: AC
  Administered 2022-08-31: 2 mg via INTRAVENOUS
  Filled 2022-08-31: qty 1

## 2022-08-31 MED ORDER — ACETAMINOPHEN 325 MG PO TABS
650.0000 mg | ORAL_TABLET | Freq: Four times a day (QID) | ORAL | Status: DC | PRN
  Administered 2022-09-03 – 2022-09-06 (×5): 650 mg via ORAL
  Filled 2022-08-31 (×5): qty 2

## 2022-08-31 MED ORDER — SODIUM CHLORIDE 0.9 % IV BOLUS
1000.0000 mL | Freq: Once | INTRAVENOUS | Status: AC
  Administered 2022-08-31: 1000 mL via INTRAVENOUS

## 2022-08-31 MED ORDER — SUCRALFATE 1 G PO TABS
1.0000 g | ORAL_TABLET | Freq: Four times a day (QID) | ORAL | Status: DC
  Administered 2022-09-02 – 2022-09-06 (×17): 1 g via ORAL
  Filled 2022-08-31 (×18): qty 1

## 2022-08-31 MED ORDER — MORPHINE SULFATE (PF) 2 MG/ML IV SOLN: 2.0000 mg | INTRAVENOUS | Status: DC | PRN

## 2022-08-31 MED ORDER — SODIUM CHLORIDE 0.9% FLUSH
3.0000 mL | Freq: Two times a day (BID) | INTRAVENOUS | Status: DC
  Administered 2022-09-01 – 2022-09-02 (×3): 3 mL via INTRAVENOUS

## 2022-08-31 MED ORDER — LORAZEPAM 2 MG/ML IJ SOLN
1.0000 mg | Freq: Once | INTRAMUSCULAR | Status: AC
  Administered 2022-09-01: 1 mg via INTRAVENOUS
  Filled 2022-08-31: qty 1

## 2022-08-31 MED ORDER — LEVOTHYROXINE SODIUM 50 MCG PO TABS
100.0000 ug | ORAL_TABLET | Freq: Every day | ORAL | Status: DC
  Administered 2022-09-03 – 2022-09-06 (×4): 100 ug via ORAL
  Filled 2022-08-31 (×4): qty 2

## 2022-08-31 MED ORDER — ACETAMINOPHEN 650 MG RE SUPP
650.0000 mg | Freq: Four times a day (QID) | RECTAL | Status: DC | PRN
  Filled 2022-08-31: qty 2

## 2022-08-31 MED ORDER — HEPARIN SODIUM (PORCINE) 5000 UNIT/ML IJ SOLN
5000.0000 [IU] | Freq: Three times a day (TID) | INTRAMUSCULAR | Status: DC
  Administered 2022-09-01 – 2022-09-06 (×16): 5000 [IU] via SUBCUTANEOUS
  Filled 2022-08-31 (×17): qty 1

## 2022-08-31 MED ORDER — IOHEXOL 350 MG/ML SOLN
75.0000 mL | Freq: Once | INTRAVENOUS | Status: AC | PRN
  Administered 2022-08-31: 75 mL via INTRAVENOUS

## 2022-08-31 MED ORDER — ATENOLOL 25 MG PO TABS
25.0000 mg | ORAL_TABLET | Freq: Every day | ORAL | Status: DC
  Administered 2022-09-02 – 2022-09-06 (×4): 25 mg via ORAL
  Filled 2022-08-31 (×5): qty 1

## 2022-08-31 MED ORDER — PANTOPRAZOLE SODIUM 40 MG PO TBEC
40.0000 mg | DELAYED_RELEASE_TABLET | Freq: Two times a day (BID) | ORAL | Status: DC
  Filled 2022-08-31: qty 1

## 2022-08-31 MED ORDER — ATORVASTATIN CALCIUM 20 MG PO TABS
40.0000 mg | ORAL_TABLET | Freq: Every day | ORAL | Status: DC
  Administered 2022-09-02 – 2022-09-06 (×5): 40 mg via ORAL
  Filled 2022-08-31 (×5): qty 2

## 2022-08-31 MED ORDER — SODIUM CHLORIDE 0.9 % IV SOLN: INTRAVENOUS | Status: DC

## 2022-08-31 MED ORDER — HYDROCODONE-ACETAMINOPHEN 5-325 MG PO TABS: 1.0000 | ORAL_TABLET | ORAL | Status: DC | PRN

## 2022-08-31 MED ORDER — SODIUM CHLORIDE 0.9 % IV SOLN
1.0000 g | Freq: Once | INTRAVENOUS | Status: AC
  Administered 2022-08-31: 1 g via INTRAVENOUS
  Filled 2022-08-31: qty 10

## 2022-08-31 NOTE — ED Triage Notes (Signed)
Presents from [facility] via EMS   the staff thought she was altered and also has edema to both LE

## 2022-08-31 NOTE — ED Notes (Signed)
Patient from [facility]- independent living with AMS and new bilateral lower leg swelling. Patient found with her medications all over the ground. Patient states she hasn't ate since Thanksgiving other than possibly a sandwich.

## 2022-08-31 NOTE — ED Provider Triage Note (Signed)
Emergency Medicine Provider Triage Evaluation Note  Cathyann Kilfoyle , a 87 y.o. female  was evaluated in triage.  Pt complains of altered mental status according to one of the staff at [facility]. Patient resides in the independent living section and is usually alert and oriented. Unknown last known well.    Physical Exam  BP 110/63   Pulse 98   Temp (!) 97.5 F (36.4 C) (Oral)   Resp 18   Ht '5\' 3"'$  (1.6 m)   Wt 84 kg   SpO2 96%   BMI 32.80 kg/m  Gen:   Awake, no distress   Resp:  Normal effort  MSK:   Moves extremities without difficulty  Other:    Medical Decision Making  Medically screening exam initiated at 2:18 PM.  Appropriate orders placed.  Ayianna Darnold was informed that the remainder of the evaluation will be completed by another provider, this initial triage assessment does not replace that evaluation, and the importance of remaining in the ED until their evaluation is complete.    Victorino Dike, FNP 08/31/22 1805

## 2022-08-31 NOTE — ED Notes (Signed)
Pt assisted to use bedside commode

## 2022-08-31 NOTE — ED Provider Notes (Signed)
[facility] Provider Note  Patient Contact: 6:02 PM (approximate)   History   Altered Mental Status   HPI  Jasmine Butler is a 87 y.o. female presents the emergency department with a friend from [facility] long-term care facility for complaint of altered mental status and peripheral edema.  It appears that patient has had some issues with peripheral edema in the past but does not have a history of congestive heart failure and does not take a diuretic.  While patient is primarily complaining of her legs, apparently the main concern is patient's altered mental status.  Patient denies any headache, vision changes, chest pain, shortness of breath, abdominal pain.  There is no reports of URI symptoms such as fevers or chills, nasal congestion, sore throat or cough.  Patient does admit to feeling loopy.  However patient is clearly confused, stating that she has appointments at other hospitals to manage her condition.  Patient is intermittently telling me there is nothing wrong, telling me that she is short of breath, has peripheral edema, feels loopy, feels weak.  It appears at this time the patient does not have the capacity to make competent decisions regarding her medical care.      Physical Exam   Triage Vital Signs: ED Triage Vitals  Enc Vitals Group     BP 08/31/22 1415 110/63     Pulse Rate 08/31/22 1415 98     Resp 08/31/22 1415 18     Temp 08/31/22 1415 (!) 97.5 F (36.4 C)     Temp Source 08/31/22 1415 Oral     SpO2 08/31/22 1415 96 %     Weight 08/31/22 1316 185 lb 3 oz (84 kg)     Height 08/31/22 1316 '5\' 3"'$  (1.6 m)     Head Circumference --      Peak Flow --      Pain Score 08/31/22 1316 0     Pain Loc --      Pain Edu? --      Excl. in [place]? --     Most recent vital signs: Vitals:   08/31/22 1930 08/31/22 2030  BP: (!) 161/107 (!) 129/92  Pulse:    Resp: 12 17  Temp:    SpO2: 99% 99%     General: Alert and in no acute distress.  Patient is  oriented to self, currently not oriented to time.  Intermittently oriented to location. Very confused appearing Eyes:  PERRL. EOMI. Head: No acute traumatic findings ENT:      Ears:       Nose: No congestion/rhinnorhea.      Mouth/Throat: Mucous membranes are moist. Neck: No stridor. No cervical spine tenderness to palpation. Hematological/Lymphatic/Immunilogical: No cervical lymphadenopathy. Cardiovascular:  Good peripheral perfusion Respiratory: Normal respiratory effort without tachypnea or retractions. Lungs CTAB. Good air entry to the bases with no decreased or absent breath sounds. Gastrointestinal: Bowel sounds 4 quadrants. Soft and nontender to palpation. No guarding or rigidity. No palpable masses. No distention. No CVA tenderness Musculoskeletal: Full range of motion to all extremities.  Neurologic:  No gross focal neurologic deficits are appreciated. CN II-XII grossly intact Skin:   No rash noted Other:   ED Results / Procedures / Treatments   Labs (all labs ordered are listed, but only abnormal results are displayed) Labs Reviewed  COMPREHENSIVE METABOLIC PANEL - Abnormal; Notable for the following components:      Result Value   Potassium 3.2 (*)    Glucose,  Bld 138 (*)    Albumin 3.3 (*)    Total Bilirubin 1.5 (*)    GFR, Estimated 60 (*)    All other components within normal limits  URINALYSIS, ROUTINE W REFLEX MICROSCOPIC - Abnormal; Notable for the following components:   Color, Urine YELLOW (*)    APPearance HAZY (*)    Hgb urine dipstick SMALL (*)    Leukocytes,Ua LARGE (*)    WBC, UA >50 (*)    All other components within normal limits  BRAIN NATRIURETIC PEPTIDE - Abnormal; Notable for the following components:   B Natriuretic Peptide 193.9 (*)    All other components within normal limits  TSH - Abnormal; Notable for the following components:   TSH 6.765 (*)    All other components within normal limits  TROPONIN I (HIGH SENSITIVITY) - Abnormal; Notable  for the following components:   Troponin I (High Sensitivity) 39 (*)    All other components within normal limits  TROPONIN I (HIGH SENSITIVITY) - Abnormal; Notable for the following components:   Troponin I (High Sensitivity) 46 (*)    All other components within normal limits  CBC  MAGNESIUM  AMMONIA  T4, FREE  T3, FREE  CBG MONITORING, ED     EKG  ED ECG REPORT I, Charline Bills Maelee Hoot,  personally viewed and interpreted this ECG.   Date: 08/31/2022  EKG Time: 1828 hrs.  Rate: 99 bpm  Rhythm: unchanged from previous tracings, normal sinus rhythm, PAC's noted, prolonged QT interval  Axis: Leftward axis  Intervals:none  ST&T Change: No gross ST elevation or depression noted  Sinus rhythm with PACs.  No evidence of STEMI.  Largely unchanged from previous EKG.  Patient does have a prolonged QTc of 534.    RADIOLOGY  I personally viewed, evaluated, and interpreted these images as part of my medical decision making, as well as reviewing the written report by the radiologist.  ED Provider Interpretation: No acute intracranial abnormality on CT scan.  Chest x-ray without any acute cardiopulmonary abnormality.  CT Head Wo Contrast  Result Date: 08/31/2022 CLINICAL DATA:  Mental status change, unknown cause. EXAM: CT HEAD WITHOUT CONTRAST TECHNIQUE: Contiguous axial images were obtained from the base of the skull through the vertex without intravenous contrast. RADIATION DOSE REDUCTION: This exam was performed according to the departmental dose-optimization program which includes automated exposure control, adjustment of the mA and/or kV according to patient size and/or use of iterative reconstruction technique. COMPARISON:  12/12/2020 FINDINGS: Brain: Normal anatomic configuration. Moderate parenchymal volume loss is commensurate with the patient's age. Mild periventricular white matter changes are present likely reflecting the sequela of small vessel ischemia. No abnormal intra or  extra-axial mass lesion or fluid collection. No abnormal mass effect or midline shift. No evidence of acute intracranial hemorrhage or infarct. Ventricular size is normal. Cerebellum unremarkable. Vascular: No asymmetric hyperdense vasculature at the skull base. Skull: Intact Sinuses/Orbits: Paranasal sinuses are clear. Orbits are unremarkable. Other: Mastoid air cells and middle ear cavities are clear. IMPRESSION: 1. No acute intracranial hemorrhage or infarct. 2. Mild senescent change. Electronically Signed   By: Fidela Salisbury M.D.   On: 08/31/2022 18:38   DG Chest 1 View  Result Date: 08/31/2022 CLINICAL DATA:  Altered mental status, peripheral edema EXAM: CHEST  1 VIEW COMPARISON:  08/22/2018 FINDINGS: Lungs are well expanded, symmetric, and clear. No pneumothorax or pleural effusion. Cardiac size within normal limits. Pulmonary vascularity is normal. Osseous structures are age-appropriate. No acute bone abnormality. IMPRESSION:  No active disease. Electronically Signed   By: Fidela Salisbury M.D.   On: 08/31/2022 18:35    PROCEDURES:  Critical Care performed: No  Procedures   MEDICATIONS ORDERED IN ED: Medications  cefTRIAXone (ROCEPHIN) 1 g in sodium chloride 0.9 % 100 mL IVPB (1 g Intravenous New Bag/Given 08/31/22 2034)  sodium chloride 0.9 % bolus 1,000 mL (1,000 mLs Intravenous New Bag/Given 08/31/22 2034)     IMPRESSION / MDM / ASSESSMENT AND PLAN / ED COURSE  I reviewed the triage vital signs and the nursing notes.                              Differential diagnosis includes, but is not limited to, UTI, CVA, electrolyte abnormality, STEMI, NSTEMI, viral illness, encephalopathy  Patient's presentation is most consistent with acute presentation with potential threat to life or bodily function.   Patient's diagnosis is consistent with confusion, UTI.  Patient presents emergency department with increasing confusion over the last 2 days.  Patient denies any complaint other than  peripheral edema.  Appears that patient does have a history of lower extremity peripheral edema without history of CHF.  Patient has minimally elevated troponin at 39 which did increase to 46., BNP of 193.  TSH is elevated but T4 is in range.  Magnesium within range.  Patient had slight decrease in potassium.  White blood cell count is reassuring.  Patient had a large amount of leukocytes on her urine.  Will start antibiotics, fluids and admit to the hospitalist service.  Patient has reassuring EKG.  CT scan of the head is reassuring with no evidence of intracranial hemorrhage or acute ischemic changes.  Patient with reassuring chest x-ray.  Patient will be admitted to the hospitalist service at this time for confusion secondary to UTI.Marland Kitchen  Hospitalist agrees to accept the patient in patient care will be transferred to their service at this time.    FINAL CLINICAL IMPRESSION(S) / ED DIAGNOSES   Final diagnoses:  Confusion  Urinary tract infection with hematuria, site unspecified     Rx / DC Orders   ED Discharge Orders     None        Note:  This document was prepared using Dragon voice recognition software and may include unintentional dictation errors.   Brynda Peon 08/31/22 2055    Blake Divine, MD 08/31/22 2322
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
## 2022-09-01 ENCOUNTER — Inpatient Hospital Stay: Payer: Medicare Other

## 2022-09-01 DIAGNOSIS — N390 Urinary tract infection, site not specified: Secondary | ICD-10-CM | POA: Diagnosis present

## 2022-09-01 DIAGNOSIS — R410 Disorientation, unspecified: Secondary | ICD-10-CM | POA: Diagnosis not present

## 2022-09-01 DIAGNOSIS — J219 Acute bronchiolitis, unspecified: Secondary | ICD-10-CM | POA: Diagnosis present

## 2022-09-01 LAB — COMPREHENSIVE METABOLIC PANEL
ALT: 25 U/L (ref 0–44)
AST: 40 U/L (ref 15–41)
Albumin: 3.2 g/dL — ABNORMAL LOW (ref 3.5–5.0)
Alkaline Phosphatase: 116 U/L (ref 38–126)
Anion gap: 9 (ref 5–15)
BUN: 15 mg/dL (ref 8–23)
CO2: 21 mmol/L — ABNORMAL LOW (ref 22–32)
Calcium: 9.3 mg/dL (ref 8.9–10.3)
Chloride: 109 mmol/L (ref 98–111)
Creatinine, Ser: 0.86 mg/dL (ref 0.44–1.00)
GFR, Estimated: >60 mL/min (ref >60)
Glucose, Bld: 127 mg/dL — ABNORMAL HIGH (ref 70–99)
Potassium: 3.5 mmol/L (ref 3.5–5.1)
Sodium: 139 mmol/L (ref 135–145)
Total Bilirubin: 1.3 mg/dL — ABNORMAL HIGH (ref 0.3–1.2)
Total Protein: 7.2 g/dL (ref 6.5–8.1)

## 2022-09-01 LAB — CBC
HCT: 45.7 % (ref 36.0–46.0)
Hemoglobin: 14.9 g/dL (ref 12.0–15.0)
MCH: 32.5 pg (ref 26.0–34.0)
MCHC: 32.6 g/dL (ref 30.0–36.0)
MCV: 99.6 fL (ref 80.0–100.0)
Platelets: 160 10*3/uL (ref 150–400)
RBC: 4.59 MIL/uL (ref 3.87–5.11)
RDW: 14.2 % (ref 11.5–15.5)
WBC: 10.8 10*3/uL — ABNORMAL HIGH (ref 4.0–10.5)
nRBC: 0 % (ref 0.0–0.2)

## 2022-09-01 LAB — CK: Total CK: 75 U/L (ref 38–234)

## 2022-09-01 LAB — PROCALCITONIN: Procalcitonin: <0.1 ng/mL

## 2022-09-01 MED ORDER — LACTATED RINGERS IV SOLN: INTRAVENOUS | Status: DC

## 2022-09-01 MED ORDER — PANTOPRAZOLE SODIUM 40 MG IV SOLR
40.0000 mg | Freq: Two times a day (BID) | INTRAVENOUS | Status: DC
  Administered 2022-09-01 – 2022-09-02 (×5): 40 mg via INTRAVENOUS
  Filled 2022-09-01 (×7): qty 10

## 2022-09-01 MED ORDER — SODIUM CHLORIDE 0.9 % IV SOLN
1.0000 g | INTRAVENOUS | Status: DC
  Administered 2022-09-01 – 2022-09-02 (×2): 1 g via INTRAVENOUS
  Filled 2022-09-01 (×2): qty 10

## 2022-09-01 MED ORDER — LORAZEPAM 2 MG/ML IJ SOLN
1.0000 mg | Freq: Once | INTRAMUSCULAR | Status: AC
  Administered 2022-09-01: 1 mg via INTRAVENOUS
  Filled 2022-09-01: qty 1

## 2022-09-01 MED ORDER — MAGNESIUM SULFATE IN D5W 1-5 GM/100ML-% IV SOLN
1.0000 g | Freq: Once | INTRAVENOUS | Status: AC
  Administered 2022-09-01: 1 g via INTRAVENOUS
  Filled 2022-09-01: qty 100

## 2022-09-01 NOTE — Assessment & Plan Note (Addendum)
Resolved with repletion. ?

## 2022-09-01 NOTE — Assessment & Plan Note (Signed)
Cont rocephin and PRN albuterol.

## 2022-09-01 NOTE — Progress Notes (Signed)
SLP Cancellation Note  Patient Details Name: Jasmine Butler MRN: [id number] DOB: 1934-10-25   Cancelled treatment:       Reason Eval/Treat Not Completed: Medical issues which prohibited therapy;Patient's level of consciousness;Patient not medically ready (chart reviewed; consulted NSG re: pt's status this afternoon) Upon entering room, pt was significantly disheveled in bed; bilat Mitts in place. She did not arouse to light stim but only seemed to become fidgity and min agitated. NSG reported she has not kept her clothes on at times. Reviewed chart notes, Imaging(noted no other admits w/ Pulmonary decline in recent 2 years).  In setting of acute AMS w/ possible UTI, and her current poor alertness, recommend strict NPO status including medications d/t high risk for aspiration. NSG updated and agreed. Will alert MD re: need for alternative means for medications currently. Recommend oral care. ST services will f/u w/ pt's status in the morning.      Orinda Kenner, [place], CCC-SLP Speech Language Pathologist Rehab Services; [place] [phone] (ascom) Einer Meals 09/01/2022, 2:54 PM

## 2022-09-01 NOTE — ED Notes (Signed)
Pt restless and moving around in bed. Pt disoriented x4. Pt repositioned in bed and redirected.

## 2022-09-01 NOTE — ED Notes (Addendum)
Pt restless and moving around in bed. Pt still resting. Pt wearing mittens and in trendelenburg position. Will continue to monitor.

## 2022-09-01 NOTE — Progress Notes (Signed)
       CROSS COVER NOTE  NAME: Bertha Lokken MRN: [id number] DOB : Feb 28, 1935 ATTENDING PHYSICIAN: Para Skeans, MD    Date of Service   09/01/2022   HPI/Events of Note   Message received from nursing reporting ongoing confusion. M(r)s Henery is attempting to get out of bed and nursing staff reports she is difficult to redirect.  Interventions   Assessment/Plan:  Ativan- prolonged QT Safety Sitter     This document was prepared using Systems analyst and may include unintentional dictation errors.  Neomia Glass DNP, MBA, FNP-BC Nurse Practitioner [facility] Pager [phone]

## 2022-09-01 NOTE — Assessment & Plan Note (Addendum)
UA with Leukocytosis and bacteriuria.  Unable to explain any urinary symptoms. Urine cultures pending.  Procalcitonin negative. -Continue with ceftriaxone -Follow-up urine cultures

## 2022-09-01 NOTE — Assessment & Plan Note (Addendum)
Attribute to urinary tract infection.  CT head negative for any acute intracranial abnormality. As needed Ativan. Neurochecks. Sitter at bedside as needed.
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
## 2022-09-01 NOTE — Assessment & Plan Note (Addendum)
  EKG shows prolonged QT.  Will avoid medications that will potentiate or worsen. Keep mag level,corrected.

## 2022-09-01 NOTE — ED Notes (Signed)
Pt agitation continues despite redirections and 1 mg ativan given prior. Pt has attempted to exit bed four times. Floor coverage NP [place] informed. Pt given an additional 1 mg of ativan per NP order. Pt repositioned in bed. New brief and linen provided. Sitter alarm on. Pt confusion responding to sitter alarm stating "I do what I want to." IV 22 G placed to right forearm for IVF and medication administration. Mittens applied. Ivs reinforced with coban. Safety sitter ordered by NP; No safety sitter available at this time.

## 2022-09-01 NOTE — Assessment & Plan Note (Signed)
Continue patient's home regimen of levothyroxine at 100 mcg.

## 2022-09-01 NOTE — ED Notes (Addendum)
Pt moved from stretcher to hospital bed. Pt cleaned up and repositioned. Pt still very confused and not alert and oriented. Going to hold all morning oral medications due to AMS. Pt has bruising noted on R shoulder and on both legs from hip to feet. MD is aware.

## 2022-09-01 NOTE — Plan of Care (Signed)
  Problem: Education: Goal: Knowledge of General Education information will improve Description: Including pain rating scale, medication(s)/side effects and non-pharmacologic comfort measures Outcome: Progressing   Problem: Health Behavior/Discharge Planning: Goal: Ability to manage health-related needs will improve Outcome: Progressing   Problem: Activity: Goal: Risk for activity intolerance will decrease Outcome: Progressing   

## 2022-09-01 NOTE — Hospital Course (Addendum)
Taken H&P.  Jasmine Butler is an 87 y.o. female with past medical history significant for peripheral neuropathy, hypothyroidism, and hyperlipidemia brought to emergency room by a friend from retirement community for concerning complaints of altered mental status and peripheral edema, no diuretic use. No reported headaches blurred vision speech or gait issues or falls.  Patient was unable to participate with any further history.  ED course.  Afebrile, mild tachycardia and tachypnea, labs pertinent for potassium of 3.2 and T. bili of 1.5.  Troponin 39>46,BNP 193, TSH elevated at 6.765, free T4 within normal limit at 0.76, ammonia within normal limit, D-dimer at 0.72 which is within normal limit if corrected for age, CTA was done in the ED which was negative for PE, did show patchy centrilobular micronodules and groundglass opacities bilaterally greater in the right upper lobe, mild interlobular septal thickening, scarring with mild bronchiectasis, scattered pulmonary nodules measuring up to 6 mm in the posterior right lobe which was not significantly changed from his prior CT done in 2018 so should be benign.  These findings can be compatible with infectious or inflammatory bronchiolitis.  No DVT on lower extremity venous Doppler. UA with leukocytosis and bacteriuria. She was started on ceftriaxone for concern of UTI  11/29: Hemodynamically stable, overnight agitation requiring Ativan.  History of prolonged QTc Ordered urine cultures as add-on.  CBC with leukocytosis at 10.8. PCT <0.10 Patient remained encephalopathic and unable to participate with any meaningful history.  11/30: Patient remained hemodynamically stable, overnight agitation, requiring sitter and still not been able to participate with any meaningful history.  Urine cultures with Proteus Mirabilis-pending susceptibility.  12/1: Hemodynamically stable.  Mentation improving and she started taking p.o.  Urine cultures with Proteus Mirabilis,   which shows resistance to ampicillin, nitrofurantoin and Bactrim.  Labs pertinent for potassium of 5.4-ordered 1 dose of Lokelma.  Antibiotics switched with Keflex.  12/2: Patient with significant improvement in mentation.  Able to eat and drink.  No agitation and able to participate with care.  Hyperkalemia resolved. Patient is being discharged on Keflex for 3 more days. Patient is being discharged to [facility] [place] facility instead of independent living.  She will continue the rest of her home medications and follow-up with her providers for further recommendations.

## 2022-09-01 NOTE — Progress Notes (Signed)
Progress Note   Patient: Jasmine Butler ZTI:[id number] DOB: 1935-01-04 DOA: 08/31/2022     1 DOS: the patient was seen and examined on 09/01/2022   Brief hospital course: Taken H&P.  Liah Morr is an 87 y.o. female with past medical history significant for peripheral neuropathy, hypothyroidism, and hyperlipidemia brought to emergency room by a friend from retirement community for concerning complaints of altered mental status and peripheral edema, no diuretic use. No reported headaches blurred vision speech or gait issues or falls.  Patient was unable to participate with any further history.  ED course.  Afebrile, mild tachycardia and tachypnea, labs pertinent for potassium of 3.2 and T. bili of 1.5.  Troponin 39>46,BNP 193, TSH elevated at 6.765, free T4 within normal limit at 0.76, ammonia within normal limit, D-dimer at 0.72 which is within normal limit if corrected for age, CTA was done in the ED which was negative for PE, did show patchy centrilobular micronodules and groundglass opacities bilaterally greater in the right upper lobe, mild interlobular septal thickening, scarring with mild bronchiectasis, scattered pulmonary nodules measuring up to 6 mm in the posterior right lobe which was not significantly changed from his prior CT done in 2018 so should be benign.  These findings can be compatible with infectious or inflammatory bronchiolitis.  No DVT on lower extremity venous Doppler. UA with leukocytosis and bacteriuria. She was started on ceftriaxone for concern of UTI  11/29: Hemodynamically stable, overnight agitation requiring Ativan.  History of prolonged QTc Ordered urine cultures as add-on.  CBC with leukocytosis at 10.8. PCT <0.10 Patient remained encephalopathic and unable to participate with any meaningful history.   Assessment and Plan: * AMS (altered mental status) Attribute to urinary tract infection.  CT head negative for any acute intracranial abnormality. As needed  Ativan. Neurochecks. Sitter at bedside as needed.  UTI (urinary tract infection) UA with Leukocytosis and bacteriuria.  Unable to explain any urinary symptoms. Urine cultures pending.  Procalcitonin negative. -Continue with ceftriaxone -Follow-up urine cultures   Bronchiolitis Cont rocephin and PRN albuterol.   Hypothyroid Continue patient's home regimen of levothyroxine at 100 mcg.  Prolonged QT interval  EKG shows prolonged QT.  Will avoid medications that will potentiate or worsen. Keep mag level,corrected.   Hypokalemia Resolved with repletion.    Subjective: Patient was seen and examined today.  Remained encephalopathic and unable to participate with any meaningful history.  Only said yes when called her name.  Unable to answer any other questions.  Physical Exam: Vitals:   09/01/22 1130 09/01/22 1200 09/01/22 1215 09/01/22 1227  BP:  119/75    Pulse: 83 79 76   Resp: '18 17 16   '$ Temp:    97.8 F (36.6 C)  TempSrc:    Oral  SpO2: 100% 100% 100%   Weight:      Height:       General.  Obese, frail elderly lady, in no acute distress. Pulmonary.  Lungs clear bilaterally, normal respiratory effort. CV.  Regular rate and rhythm, no JVD, rub or murmur. Abdomen.  Soft, nontender, nondistended, BS positive. CNS.  Awake and oriented to name only.  No apparent focal neurologic deficit. Extremities.  No edema, no cyanosis, pulses intact and symmetrical. Psychiatry.  Judgment and insight appears impaired  Data Reviewed: Prior data reviewed  Family Communication: Tried calling 2 daughters listed in her chart with no success.  I tried calling [facility] but they will keep transferring me to different services as they did not recognize  the patient, unable to reach anyone at independent living side.  Disposition: Status is: Inpatient Remains inpatient appropriate because: Severity of illness.  Planned Discharge Destination:  To be determined.  Time spent: 50  minutes  This record has been created using Systems analyst. Errors have been sought and corrected,but may not always be located. Such creation errors do not reflect on the standard of care.   Author: Lorella Nimrod, MD 09/01/2022 1:20 PM  For on call review [email or web address].

## 2022-09-01 NOTE — ED Notes (Signed)
Dr Posey Pronto was informed by previous RN pt refused PM medications. Pt agitated. Orders for 1 mg of ativan placed. On this Rns initial assessment, pt's confusion continues. Pt is alert to self and place. IV reinforced with coban.

## 2022-09-02 DIAGNOSIS — R410 Disorientation, unspecified: Secondary | ICD-10-CM | POA: Diagnosis not present

## 2022-09-02 LAB — BASIC METABOLIC PANEL
Anion gap: 5 (ref 5–15)
BUN: 15 mg/dL (ref 8–23)
CO2: 26 mmol/L (ref 22–32)
Calcium: 9.2 mg/dL (ref 8.9–10.3)
Chloride: 110 mmol/L (ref 98–111)
Creatinine, Ser: 0.86 mg/dL (ref 0.44–1.00)
GFR, Estimated: >60 mL/min (ref >60)
Glucose, Bld: 93 mg/dL (ref 70–99)
Potassium: 3.6 mmol/L (ref 3.5–5.1)
Sodium: 141 mmol/L (ref 135–145)

## 2022-09-02 LAB — T3, FREE: T3, Free: 2.2 pg/mL (ref 2.0–4.4)

## 2022-09-02 LAB — HEMOGLOBIN A1C
Hgb A1c MFr Bld: 5.4 % (ref 4.8–5.6)
Mean Plasma Glucose: 108 mg/dL

## 2022-09-02 LAB — MAGNESIUM: Magnesium: 1.7 mg/dL (ref 1.7–2.4)

## 2022-09-02 MED ORDER — METOPROLOL TARTRATE 5 MG/5ML IV SOLN
5.0000 mg | Freq: Once | INTRAVENOUS | Status: AC
  Administered 2022-09-02: 5 mg via INTRAVENOUS
  Filled 2022-09-02: qty 5

## 2022-09-02 MED ORDER — MAGNESIUM SULFATE 2 GM/50ML IV SOLN
2.0000 g | Freq: Once | INTRAVENOUS | Status: AC
  Administered 2022-09-02: 2 g via INTRAVENOUS
  Filled 2022-09-02: qty 50

## 2022-09-02 MED ORDER — LORAZEPAM 2 MG/ML IJ SOLN
1.0000 mg | Freq: Once | INTRAMUSCULAR | Status: AC
  Administered 2022-09-02: 1 mg via INTRAVENOUS
  Filled 2022-09-02: qty 1

## 2022-09-02 MED ORDER — LACTATED RINGERS IV BOLUS
500.0000 mL | Freq: Once | INTRAVENOUS | Status: AC
  Administered 2022-09-02: 500 mL via INTRAVENOUS

## 2022-09-02 MED ORDER — DILTIAZEM HCL 25 MG/5ML IV SOLN
10.0000 mg | Freq: Once | INTRAVENOUS | Status: DC
  Filled 2022-09-02: qty 5

## 2022-09-02 MED ORDER — POTASSIUM CHLORIDE CRYS ER 20 MEQ PO TBCR
40.0000 meq | EXTENDED_RELEASE_TABLET | Freq: Once | ORAL | Status: AC
  Administered 2022-09-02: 40 meq via ORAL
  Filled 2022-09-02: qty 2

## 2022-09-02 NOTE — Progress Notes (Signed)
       CROSS COVER NOTE  NAME: Jasmine Butler MRN: [id number] DOB : [birth year] ATTENDING PHYSICIAN: Para Skeans, MD    Date of Service   09/02/2022   HPI/Events of Note   Message received from nursing reporting ongoing confusion. M(r)s Jasmine Butler is attempting to get out of bed to use the restroom.  0400: Notified that M(r)s Jasmine Butler is in AFIB on telemetry monitoring HR 125 BP 130/81. M(r)s Jasmine Butler is on daily atenolol. Will give IV metoprolol. TSH 6.765 on 08/31/22.  Interventions   Assessment/Plan:  AMS Ativan 1 mg x1  New Onset Atrial Fibrillation with Rapid Ventricular Response 5 mg IV Metoprolol x1 LR bolus Consider increase in Synthroid dose Will need anticoagulation if rhythm persists      This document was prepared using Dragon voice recognition software and may include unintentional dictation errors.  Neomia Glass DNP, MBA, FNP-BC Nurse Practitioner [facility] Pager [phone]

## 2022-09-02 NOTE — Plan of Care (Signed)
  Problem: Clinical Measurements: Goal: Diagnostic test results will improve Outcome: Progressing   Problem: Elimination: Goal: Will not experience complications related to urinary retention Outcome: Progressing   Problem: Pain Managment: Goal: General experience of comfort will improve Outcome: Progressing

## 2022-09-02 NOTE — Assessment & Plan Note (Addendum)
UA with Leukocytosis and bacteriuria.  Unable to explain any urinary symptoms. Urine cultures with 10,000 colonies of Proteus mirabilis-only showed resistant to ampicillin, nitrofurantoin and Bactrim.  Procalcitonin negative. -Switch to Keflex to complete a 5-day course

## 2022-09-02 NOTE — Assessment & Plan Note (Signed)
Patient had mildly elevated TSH with normal free T4 Continue patient's home regimen of levothyroxine at 100 mcg. Need to repeat levels in 3 to 4 weeks after this acute illness by PCP

## 2022-09-02 NOTE — TOC Initial Note (Signed)
Transition of Care [facility]) - Initial/Assessment Note    Patient Details  Name: Jasmine Butler MRN: [id number] Date of Birth: 1935-03-13  Transition of Care [facility]) CM/SW Contact:    Conception Oms, RN Phone Number: 09/02/2022, 3:32 PM  Clinical Narrative:                 The patient had a friend bring her in from [place] [facility] where she lives in independent home She is confused and has been agitated needing mittens    She is likely to need SNF as she has in the past TOC to asssit with DC planning        Patient Goals and CMS Choice        Expected Discharge Plan and Services                                                Prior Living Arrangements/Services                       Activities of Daily Living      Permission Sought/Granted                  Emotional Assessment              Admission diagnosis:  Confusion [R41.0] Urinary tract infection with hematuria, site unspecified [N39.0, R31.9] AMS (altered mental status) [R41.82] Patient Active Problem List   Diagnosis Date Noted   UTI (urinary tract infection) 09/01/2022   Bronchiolitis 09/01/2022   AMS (altered mental status) 08/31/2022   H. pylori infection    Hypokalemia    Intractable vomiting with nausea 12/12/2020   Hypothyroid 12/12/2020   Prolonged QT interval 12/12/2020   Peripheral neuropathy 08/01/2019   PCP:  Baxter Hire, MD Pharmacy:   [id number] - [address], [place] [address] Phone: [phone] Fax: [phone]  [place], [place] - [address] [place] [address] Phone: [phone] Fax: [phone]     Social Determinants of Health (SDOH) Interventions    Readmission Risk Interventions     No data to display

## 2022-09-02 NOTE — Assessment & Plan Note (Addendum)
Became hyperkalemic after getting replacement. -Continue to monitor

## 2022-09-02 NOTE — Progress Notes (Signed)
Progress Note   Patient: Jasmine Butler HYI:[id number] DOB: 30-Oct-1934 DOA: 08/31/2022     2 DOS: the patient was seen and examined on 09/02/2022   Brief hospital course: Taken H&P.  Jasmine Butler is an 87 y.o. female with past medical history significant for peripheral neuropathy, hypothyroidism, and hyperlipidemia brought to emergency room by a friend from retirement community for concerning complaints of altered mental status and peripheral edema, no diuretic use. No reported headaches blurred vision speech or gait issues or falls.  Patient was unable to participate with any further history.  ED course.  Afebrile, mild tachycardia and tachypnea, labs pertinent for potassium of 3.2 and T. bili of 1.5.  Troponin 39>46,BNP 193, TSH elevated at 6.765, free T4 within normal limit at 0.76, ammonia within normal limit, D-dimer at 0.72 which is within normal limit if corrected for age, CTA was done in the ED which was negative for PE, did show patchy centrilobular micronodules and groundglass opacities bilaterally greater in the right upper lobe, mild interlobular septal thickening, scarring with mild bronchiectasis, scattered pulmonary nodules measuring up to 6 mm in the posterior right lobe which was not significantly changed from his prior CT done in 2018 so should be benign.  These findings can be compatible with infectious or inflammatory bronchiolitis.  No DVT on lower extremity venous Doppler. UA with leukocytosis and bacteriuria. She was started on ceftriaxone for concern of UTI  11/29: Hemodynamically stable, overnight agitation requiring Ativan.  History of prolonged QTc Ordered urine cultures as add-on.  CBC with leukocytosis at 10.8. PCT <0.10 Patient remained encephalopathic and unable to participate with any meaningful history.  11/30: Patient remained hemodynamically stable, overnight agitation, requiring sitter and still not been able to participate with any meaningful history.  Urine cultures  with Proteus Mirabilis-pending susceptibility.   Assessment and Plan: * AMS (altered mental status) Attribute to urinary tract infection.  CT head negative for any acute intracranial abnormality.  Patient still remained quite encephalopathic although some improvement and more response as compared to yesterday. As needed Ativan. Neurochecks. Sitter at bedside as needed.  UTI (urinary tract infection) UA with Leukocytosis and bacteriuria.  Unable to explain any urinary symptoms. Urine cultures with 10,000 colonies of Proteus mirabilis-pending susceptibility.  Procalcitonin negative. -Continue with ceftriaxone -Follow-up final urine cultures results   Bronchiolitis Cont rocephin and PRN albuterol.   Hypothyroid Patient had mildly elevated TSH with normal free T4 Continue patient's home regimen of levothyroxine at 100 mcg. Need to repeat levels in 3 to 4 weeks after this acute illness by PCP  Prolonged QT interval  EKG shows prolonged QT.  Will avoid medications that will potentiate or worsen. Keep mag level,corrected.   Hypokalemia Although potassium and magnesium within lower normal limit we will rather keep it potassium at 4 and magnesium at 2 for concern of prolonged QTc. -Replete electrolytes and monitor    Subjective: Patient was seen and examined today.  Appears lethargic, overnight agitation, sitter at bedside.  Little more cooperative than yesterday.  Denies any pain.  Physical Exam: Vitals:   09/01/22 2006 09/02/22 0409 09/02/22 0616 09/02/22 0839  BP: (!) 144/75 130/81 119/79 102/77  Pulse: 79 (!) 125 84 89  Resp: '16 18 17 16  '$ Temp:   98.1 F (36.7 C) (!) 97.4 F (36.3 C)  TempSrc:    Axillary  SpO2: 98% 95% 100% 97%  Weight:      Height:       General.  Lethargic elderly lady, in no  acute distress. Pulmonary.  Lungs clear bilaterally, normal respiratory effort. CV.  Regular rate and rhythm, no JVD, rub or murmur. Abdomen.  Soft, nontender, nondistended,  BS positive. CNS.  Alert and oriented .  No focal neurologic deficit. Extremities.  No edema, no cyanosis, pulses intact and symmetrical. Psychiatry.  Judgment and insight appears impaired.  Data Reviewed: Prior data reviewed  Family Communication: Talked with daughter on phone.  Disposition: Status is: Inpatient Remains inpatient appropriate because: Severity of illness.  Planned Discharge Destination:  To be determined.  Time spent: 50 minutes  This record has been created using Systems analyst. Errors have been sought and corrected,but may not always be located. Such creation errors do not reflect on the standard of care.   Author: Lorella Nimrod, MD 09/02/2022 2:24 PM  For on call review [email or web address].

## 2022-09-02 NOTE — NC FL2 (Signed)
[place] LEVEL OF CARE FORM     IDENTIFICATION  Patient Name: Jasmine Butler Birthdate: 1935/01/23 Sex: female Admission Date (Current Location): 08/31/2022  [facility] and [place] Number:  Engineering geologist and Address:  [facility], [address] [place], [place] [address]      Provider Number: [id number]  Attending Physician Name and Address:  Lorella Nimrod, MD  Relative Name and Phone Number:       Current Level of Care: Hospital Recommended Level of Care: [place] Prior Approval Number:    Date Approved/Denied:   PASRR Number: [id number]  Discharge Plan: SNF    Current Diagnoses: Patient Active Problem List   Diagnosis Date Noted   UTI (urinary tract infection) 09/01/2022   Bronchiolitis 09/01/2022   AMS (altered mental status) 08/31/2022   H. pylori infection    Hypokalemia    Intractable vomiting with nausea 12/12/2020   Hypothyroid 12/12/2020   Prolonged QT interval 12/12/2020   Peripheral neuropathy 08/01/2019    Orientation RESPIRATION BLADDER Height & Weight      (disoriented x4)  Normal Incontinent, External catheter Weight: 185 lb 3 oz (84 kg) Height:  '5\' 3"'$  (160 cm)  BEHAVIORAL SYMPTOMS/MOOD NEUROLOGICAL BOWEL NUTRITION STATUS  Other (Comment) (Some confusion and agitation)  (none) Continent Diet (Dys 1 no straws)  AMBULATORY STATUS COMMUNICATION OF NEEDS Skin   Extensive Assist Verbally Bruising, Skin abrasions                       Personal Care Assistance Level of Assistance  Dressing, Feeding, Bathing Bathing Assistance: Maximum assistance Feeding assistance: Limited assistance Dressing Assistance: Maximum assistance     Functional Limitations Info  Sight, Hearing, Speech Sight Info: Adequate Hearing Info: Adequate Speech Info: Adequate    SPECIAL CARE FACTORS FREQUENCY  PT (By licensed PT), OT (By licensed OT), Speech therapy     PT Frequency: 5 times a week OT  Frequency: 5 Times a week     Speech Therapy Frequency: 5 Times a week      Contractures Contractures Info: Not present    Additional Factors Info  Code Status, Allergies Code Status Info: Full Code Allergies Info: cefurixune axetil, ciprofloxacin, shellfish, chicken/poultry           Current Medications (09/02/2022):  This is the current hospital active medication list Current Facility-Administered Medications  Medication Dose Route Frequency Provider Last Rate Last Admin   acetaminophen (TYLENOL) tablet 650 mg  650 mg Oral Q6H PRN Para Skeans, MD       Or   acetaminophen (TYLENOL) suppository 650 mg  650 mg Rectal Q6H PRN Para Skeans, MD       atenolol (TENORMIN) tablet 25 mg  25 mg Oral Daily Florina Ou V, MD   25 mg at 09/02/22 0915   atorvastatin (LIPITOR) tablet 40 mg  40 mg Oral Daily Florina Ou V, MD   40 mg at 09/02/22 0915   cefTRIAXone (ROCEPHIN) 1 g in sodium chloride 0.9 % 100 mL IVPB  1 g Intravenous Q24H Florina Ou V, MD 200 mL/hr at 09/01/22 1735 1 g at 09/01/22 1735   heparin injection 5,000 Units  5,000 Units Subcutaneous Q8H Para Skeans, MD   5,000 Units at 09/02/22 1211   HYDROcodone-acetaminophen (NORCO/VICODIN) 5-325 MG per tablet 1 tablet  1 tablet Oral Q4H PRN Para Skeans, MD       lactated ringers infusion   Intravenous  Continuous Lorella Nimrod, MD 100 mL/hr at 09/02/22 0504 Infusion Verify at 09/02/22 0504   levothyroxine (SYNTHROID) tablet 100 mcg  100 mcg Oral Daily Para Skeans, MD       magnesium sulfate IVPB 2 g 50 mL  2 g Intravenous Once Wynelle [place], [facility]       morphine (PF) 2 MG/ML injection 2 mg  2 mg Intravenous Q4H PRN Para Skeans, MD       pantoprazole (PROTONIX) injection 40 mg  40 mg Intravenous Q12H Florina Ou V, MD   40 mg at 09/02/22 1212   potassium chloride SA (KLOR-CON M) CR tablet 40 mEq  40 mEq Oral Once Wynelle [place], [facility]       sodium chloride flush (NS) 0.9 % injection 3 mL  3 mL Intravenous Q12H Florina Ou V, MD   3 mL at 09/01/22 2243   sucralfate (CARAFATE) tablet 1 g  1 g Oral QID Para Skeans, MD   1 g at 09/02/22 1211     Discharge Medications: Please see discharge summary for a list of discharge medications.  Relevant Imaging Results:  Relevant Lab Results:   Additional Information SS # [id number]  VZCHYIF [id number], LCSW

## 2022-09-02 NOTE — Assessment & Plan Note (Addendum)
Attribute to urinary tract infection.  CT head negative for any acute intracranial abnormality.  Mentation improving and she started taking p.o. As needed Ativan. Neurochecks. Sitter at bedside as needed.

## 2022-09-02 NOTE — Progress Notes (Signed)
Patient is agitated and restless, trying to get up from bed, not following commands, patient had episode of Afib in tele monitor, NP K. Foust notified.
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
## 2022-09-02 NOTE — Care Management Important Message (Signed)
Important Message  Patient Details  Name: Jasmine Butler MRN: [id number] Date of Birth: [birth year]   Medicare Important Message Given:  N/A - LOS <3 / Initial given by admissions     Dannette Barbara 09/02/2022, 5:34 PM

## 2022-09-03 DIAGNOSIS — E875 Hyperkalemia: Secondary | ICD-10-CM | POA: Diagnosis not present

## 2022-09-03 LAB — BASIC METABOLIC PANEL
Anion gap: 6 (ref 5–15)
BUN: 16 mg/dL (ref 8–23)
CO2: 23 mmol/L (ref 22–32)
Calcium: 9.2 mg/dL (ref 8.9–10.3)
Chloride: 111 mmol/L (ref 98–111)
Creatinine, Ser: 0.91 mg/dL (ref 0.44–1.00)
GFR, Estimated: >60 mL/min (ref >60)
Glucose, Bld: 78 mg/dL (ref 70–99)
Potassium: 5.4 mmol/L — ABNORMAL HIGH (ref 3.5–5.1)
Sodium: 140 mmol/L (ref 135–145)

## 2022-09-03 LAB — URINE CULTURE: Culture: 10000 — AB

## 2022-09-03 LAB — MAGNESIUM: Magnesium: 2.4 mg/dL (ref 1.7–2.4)

## 2022-09-03 MED ORDER — SODIUM ZIRCONIUM CYCLOSILICATE 10 G PO PACK
10.0000 g | PACK | Freq: Once | ORAL | Status: AC
  Administered 2022-09-03: 10 g via ORAL
  Filled 2022-09-03: qty 1

## 2022-09-03 MED ORDER — CEPHALEXIN 500 MG PO CAPS
500.0000 mg | ORAL_CAPSULE | Freq: Two times a day (BID) | ORAL | Status: AC
  Administered 2022-09-03 – 2022-09-04 (×4): 500 mg via ORAL
  Filled 2022-09-03 (×4): qty 1

## 2022-09-03 NOTE — Assessment & Plan Note (Signed)
Most likely iatrogenic as she received replacement for hypokalemia. Potassium at 5.4. -Giving 1 dose of Lokelma -Monitor potassium

## 2022-09-03 NOTE — Progress Notes (Signed)
Progress Note   Patient: Jasmine Butler PYK:[id number] DOB: 1935/06/25 DOA: 08/31/2022     3 DOS: the patient was seen and examined on 09/03/2022   Brief hospital course: Taken H&P.  Ray Glacken is an 87 y.o. female with past medical history significant for peripheral neuropathy, hypothyroidism, and hyperlipidemia brought to emergency room by a friend from retirement community for concerning complaints of altered mental status and peripheral edema, no diuretic use. No reported headaches blurred vision speech or gait issues or falls.  Patient was unable to participate with any further history.  ED course.  Afebrile, mild tachycardia and tachypnea, labs pertinent for potassium of 3.2 and T. bili of 1.5.  Troponin 39>46,BNP 193, TSH elevated at 6.765, free T4 within normal limit at 0.76, ammonia within normal limit, D-dimer at 0.72 which is within normal limit if corrected for age, CTA was done in the ED which was negative for PE, did show patchy centrilobular micronodules and groundglass opacities bilaterally greater in the right upper lobe, mild interlobular septal thickening, scarring with mild bronchiectasis, scattered pulmonary nodules measuring up to 6 mm in the posterior right lobe which was not significantly changed from his prior CT done in 2018 so should be benign.  These findings can be compatible with infectious or inflammatory bronchiolitis.  No DVT on lower extremity venous Doppler. UA with leukocytosis and bacteriuria. She was started on ceftriaxone for concern of UTI  11/29: Hemodynamically stable, overnight agitation requiring Ativan.  History of prolonged QTc Ordered urine cultures as add-on.  CBC with leukocytosis at 10.8. PCT <0.10 Patient remained encephalopathic and unable to participate with any meaningful history.  11/30: Patient remained hemodynamically stable, overnight agitation, requiring sitter and still not been able to participate with any meaningful history.  Urine cultures  with Proteus Mirabilis-pending susceptibility.  12/1: Hemodynamically stable.  Mentation improving and she started taking p.o.  Urine cultures with Proteus Mirabilis,  which shows resistance to ampicillin, nitrofurantoin and Bactrim.  Labs pertinent for potassium of 5.4-ordered 1 dose of Lokelma.  Antibiotics switched with Keflex   Assessment and Plan: * AMS (altered mental status) Attribute to urinary tract infection.  CT head negative for any acute intracranial abnormality.  Mentation improving and she started taking p.o. As needed Ativan. Neurochecks. Sitter at bedside as needed.  UTI (urinary tract infection) UA with Leukocytosis and bacteriuria.  Unable to explain any urinary symptoms. Urine cultures with 10,000 colonies of Proteus mirabilis-only showed resistant to ampicillin, nitrofurantoin and Bactrim.  Procalcitonin negative. -Switch to Keflex to complete a 5-day course    Bronchiolitis Cont rocephin and PRN albuterol.   Hypothyroid Patient had mildly elevated TSH with normal free T4 Continue patient's home regimen of levothyroxine at 100 mcg. Need to repeat levels in 3 to 4 weeks after this acute illness by PCP  Prolonged QT interval  EKG shows prolonged QT.  Will avoid medications that will potentiate or worsen. Keep mag level,corrected.   Hypokalemia Became hyperkalemic after getting replacement. -Continue to monitor  Hyperkalemia Most likely iatrogenic as she received replacement for hypokalemia. Potassium at 5.4. -Giving 1 dose of Lokelma -Monitor potassium   Subjective: Patient was more alert and interactive today.  Started taking p.o.  Still at times becoming agitated. Oriented to self only.  Denies any pain  Physical Exam: Vitals:   09/02/22 0616 09/02/22 0839 09/03/22 0446 09/03/22 1000  BP: 119/79 102/77  (!) 98/56  Pulse: 84 89  90  Resp: '17 16  16  '$ Temp: 98.1 F (36.7  C) (!) 97.4 F (36.3 C)  (!) 97.2 F (36.2 C)  TempSrc:  Axillary   Axillary  SpO2: 100% 97%  95%  Weight:   69.3 kg   Height:       General.  Chronically ill-appearing elderly lady, in no acute distress. Pulmonary.  Lungs clear bilaterally, normal respiratory effort. CV.  Regular rate and rhythm, no JVD, rub or murmur. Abdomen.  Soft, nontender, nondistended, BS positive. CNS.  Alert and oriented to self only.  No focal neurologic deficit. Extremities.  No edema, no cyanosis, pulses intact and symmetrical. Psychiatry.  Judgment and insight appears impaired.  Data Reviewed: Prior data reviewed  Family Communication: Talked with daughter on phone.  Disposition: Status is: Inpatient Remains inpatient appropriate because: Severity of illness.  Planned Discharge Destination: SNF Time spent: 45 minutes  This record has been created using Systems analyst. Errors have been sought and corrected,but may not always be located. Such creation errors do not reflect on the standard of care.   Author: Lorella Nimrod, MD 09/03/2022 3:50 PM  For on call review [email or web address].

## 2022-09-03 NOTE — TOC Progression Note (Signed)
Transition of Care [facility]) - CM/SW Discharge Note   Patient Details  Name: Jasmine Butler MRN: [id number] Date of Birth: 1935-03-24  [facility] CM/SW Contact:  Conception Oms, RN Phone Number: 09/03/2022, 9:58 AM   Clinical Narrative:      Attempted to reach the patient's daughter Anderson Malta and the number listed is not accurate, I reached out to Wells Guiles the other daughter and was not able to reach, left a general VM asking for a call back [facility] has offered a bed the patient lives in the [facility]   Barriers to Discharge: SNF Pending bed offer   Patient Goals and CMS Choice        Discharge Placement                       Discharge Plan and Services   Discharge Planning Services: CM Consult            DME Arranged: N/A DME Agency: NA                  Social Determinants of Health (SDOH) Interventions     Readmission Risk Interventions     No data to display

## 2022-09-03 NOTE — Progress Notes (Signed)
Speech Language Pathology Treatment: Dysphagia  Patient Details Name: Jasmine Butler MRN: [id number] DOB: 1935/08/07 Today's Date: 09/03/2022 Time: 9470-9628 SLP Time Calculation (min) (ACUTE ONLY): 10 min  Assessment / Plan / Recommendation Clinical Impression  Patient presents with elevated risk of aspiration iso AMS. Dentures not available limiting mastication and patient unable to verbalize mastication capacity without dentures for posterior dentition. Limited re-assessment of PO trials 2' patient lack of interest and reported stomachache. Patient has demonstrated tolerance of current modified diet puree solids with thin liquids as no s/s aspiration demonstrated on SLP follow up as well as clinical indicators without increased temperature nor respiratory changes (patient remains afebrile on room air, no new chest imaging and no new WBC numbers). Recommend continued modified diet as is. SLP to follow up for diet advancement with improved cognitive status.    HPI HPI: Per H&P "Jasmine Butler is an 87 y.o. female brought to emergency room by a friend presents with leg retirement community for concerning complaints of altered mental status and peripheral edema, no diuretic use.  No reported headaches blurred vision speech or gait issues or falls.  During my exam patient is awake delirious oriented to her name.  Patient states she is loopy."   Recommendations for follow up therapy are one component of a multi-disciplinary discharge planning process, led by the attending physician.  Recommendations may be updated based on patient status, additional functional criteria and insurance authorization.    Recommendations  Diet recommendations: Dysphagia 1 (puree);Thin liquid Liquids provided via: Cup;Straw Medication Administration: Crushed with puree Supervision: Full supervision/cueing for compensatory strategies Compensations: Minimize environmental distractions;Slow rate;Small sips/bites Postural Changes and/or  Swallow Maneuvers: Seated upright 90 degrees         Oral Care Recommendations: Oral care QID Follow Up Recommendations: Skilled nursing-short term rehab (<3 hours/day) SLP Visit Diagnosis: Dysphagia, oral phase (R13.11) Plan: Continue with current plan of care      [place], M.A. CCC-SLP    [place] L Herberta Pickron  09/03/2022, 11:26 AM

## 2022-09-03 NOTE — TOC Progression Note (Addendum)
Transition of Care [facility]) - Progression Note    Patient Details  Name: Jasmine Butler MRN: [id number] Date of Birth: Jan 25, 1935  [facility]) CM/SW [place], RN Phone Number: 09/03/2022, 11:53 AM  Clinical Narrative:     Damaris Schooner with Wells Guiles the daughter and she statesd that her and her sister are agreeable for the patient to go to [facility] to the rehab portion at [place], she provided with an accurate number for Anderson Malta and stated that Anderson Malta will be here on Sunday she is flying in [phone]  Expected Discharge Plan: [place] Barriers to Discharge: SNF Pending bed offer  Expected Discharge Plan and Services Expected Discharge Plan: [place]   Discharge Planning Services: CM Consult   Living arrangements for the past 2 months: Single Family Home                 DME Arranged: N/A DME Agency: NA                   Social Determinants of Health (SDOH) Interventions    Readmission Risk Interventions     No data to display

## 2022-09-03 NOTE — Plan of Care (Signed)
  Problem: Clinical Measurements: Goal: Diagnostic test results will improve Outcome: Progressing Goal: Cardiovascular complication will be avoided Outcome: Progressing   Problem: Coping: Goal: Level of anxiety will decrease Outcome: Progressing   Problem: Pain Managment: Goal: General experience of comfort will improve Outcome: Progressing

## 2022-09-04 LAB — BASIC METABOLIC PANEL
Anion gap: 8 (ref 5–15)
BUN: 18 mg/dL (ref 8–23)
CO2: 23 mmol/L (ref 22–32)
Calcium: 9.4 mg/dL (ref 8.9–10.3)
Chloride: 109 mmol/L (ref 98–111)
Creatinine, Ser: 1.03 mg/dL — ABNORMAL HIGH (ref 0.44–1.00)
GFR, Estimated: 54 mL/min — ABNORMAL LOW (ref >60)
Glucose, Bld: 76 mg/dL (ref 70–99)
Potassium: 4.5 mmol/L (ref 3.5–5.1)
Sodium: 140 mmol/L (ref 135–145)

## 2022-09-04 LAB — MAGNESIUM: Magnesium: 2.2 mg/dL (ref 1.7–2.4)

## 2022-09-04 MED ORDER — CEPHALEXIN 500 MG PO CAPS: 500.0000 mg | ORAL_CAPSULE | Freq: Two times a day (BID) | ORAL | 0 refills | Status: DC

## 2022-09-04 NOTE — TOC Progression Note (Signed)
Transition of Care [facility]) - Progression Note    Patient Details  Name: Jasmine Butler MRN: [id number] Date of Birth: 19-Dec-1934  Transition of Care [facility]) CM/SW Contact  Izola Price, RN Phone Number: 09/04/2022, 11:54 AM  Clinical Narrative: 09/04/22: Damaris Schooner with Charna Archer, Baltazar Najjar, at [facility], where patient has been offered and accepted a bed placement. They can take patient tomorrow, 09/15/22, if patient remains safety sitter free today and tonight. Updated provider. [name] RN CM        Expected Discharge Plan: Skilled Nursing Facility Barriers to Discharge: SNF Pending bed offer  Expected Discharge Plan and Services Expected Discharge Plan: [place]   Discharge Planning Services: CM Consult   Living arrangements for the past 2 months: Single Family Home Expected Discharge Date: 09/04/22               DME Arranged: N/A DME Agency: NA                   Social Determinants of Health (SDOH) Interventions    Readmission Risk Interventions     No data to display

## 2022-09-04 NOTE — Discharge Summary (Signed)
Physician Discharge Summary   Patient: Jasmine Butler MRN: [id number] DOB: 1935/02/14  Admit date:     08/31/2022  Discharge date: 09/04/22  Discharge Physician: Lorella Nimrod   PCP: Baxter Hire, MD   Recommendations at discharge:  Please obtain CBC and BMP in 1 week Follow-up with primary care provider within a week Ensure completion of antibiotics Patient need encouragement to eat and drink-please encourage p.o. hydration  Discharge Diagnoses: Principal Problem:   AMS (altered mental status) Active Problems:   UTI (urinary tract infection)   Bronchiolitis   Hypothyroid   Prolonged QT interval   Hypokalemia   Hyperkalemia   Hospital Course: Taken H&P.  Jasmine Butler is an 87 y.o. female with past medical history significant for peripheral neuropathy, hypothyroidism, and hyperlipidemia brought to emergency room by a friend from retirement community for concerning complaints of altered mental status and peripheral edema, no diuretic use. No reported headaches blurred vision speech or gait issues or falls.  Patient was unable to participate with any further history.  ED course.  Afebrile, mild tachycardia and tachypnea, labs pertinent for potassium of 3.2 and T. bili of 1.5.  Troponin 39>46,BNP 193, TSH elevated at 6.765, free T4 within normal limit at 0.76, ammonia within normal limit, D-dimer at 0.72 which is within normal limit if corrected for age, CTA was done in the ED which was negative for PE, did show patchy centrilobular micronodules and groundglass opacities bilaterally greater in the right upper lobe, mild interlobular septal thickening, scarring with mild bronchiectasis, scattered pulmonary nodules measuring up to 6 mm in the posterior right lobe which was not significantly changed from his prior CT done in 2018 so should be benign.  These findings can be compatible with infectious or inflammatory bronchiolitis.  No DVT on lower extremity venous Doppler. UA with leukocytosis and  bacteriuria. She was started on ceftriaxone for concern of UTI  11/29: Hemodynamically stable, overnight agitation requiring Ativan.  History of prolonged QTc Ordered urine cultures as add-on.  CBC with leukocytosis at 10.8. PCT <0.10 Patient remained encephalopathic and unable to participate with any meaningful history.  11/30: Patient remained hemodynamically stable, overnight agitation, requiring sitter and still not been able to participate with any meaningful history.  Urine cultures with Proteus Mirabilis-pending susceptibility.  12/1: Hemodynamically stable.  Mentation improving and she started taking p.o.  Urine cultures with Proteus Mirabilis,  which shows resistance to ampicillin, nitrofurantoin and Bactrim.  Labs pertinent for potassium of 5.4-ordered 1 dose of Lokelma.  Antibiotics switched with Keflex.  12/2: Patient with significant improvement in mentation.  Able to eat and drink.  No agitation and able to participate with care.  Hyperkalemia resolved. Patient is being discharged on Keflex for 3 more days. Patient is being discharged to [facility] [place] facility instead of independent living.  She will continue the rest of her home medications and follow-up with her providers for further recommendations.   Assessment and Plan: * AMS (altered mental status) Attribute to urinary tract infection.  CT head negative for any acute intracranial abnormality.  Mentation improving and she started taking p.o. As needed Ativan. Neurochecks. Sitter at bedside as needed.  UTI (urinary tract infection) UA with Leukocytosis and bacteriuria.  Unable to explain any urinary symptoms. Urine cultures with 10,000 colonies of Proteus mirabilis-only showed resistant to ampicillin, nitrofurantoin and Bactrim.  Procalcitonin negative. -Switch to Keflex to complete a 5-day course    Bronchiolitis Cont rocephin and PRN albuterol.   Hypothyroid Patient had mildly  elevated TSH with  normal free T4 Continue patient's home regimen of levothyroxine at 100 mcg. Need to repeat levels in 3 to 4 weeks after this acute illness by PCP  Prolonged QT interval  EKG shows prolonged QT.  Will avoid medications that will potentiate or worsen. Keep mag level,corrected.   Hypokalemia Became hyperkalemic after getting replacement. -Continue to monitor  Hyperkalemia Most likely iatrogenic as she received replacement for hypokalemia. Potassium at 5.4. -Giving 1 dose of Lokelma -Monitor potassium   Consultants: None Procedures performed: None Disposition: Skilled nursing facility Diet recommendation:  Discharge Diet Orders (From admission, onward)     Start     Ordered   09/04/22 0000  Diet - low sodium heart healthy        09/04/22 1124           Regular diet DISCHARGE MEDICATION: Allergies as of 09/04/2022       Reactions   Cefuroxime Axetil Diarrhea   Ciprofloxacin Hives   Other    Chicken/poultry   Shellfish Allergy         Medication List     STOP taking these medications    ammonium lactate 12 % cream Commonly known as: AMLACTIN   amoxicillin 500 MG capsule Commonly known as: AMOXIL   clarithromycin 500 MG tablet Commonly known as: BIAXIN   clotrimazole-betamethasone cream Commonly known as: LOTRISONE       TAKE these medications    atenolol 25 MG tablet Commonly known as: TENORMIN Take 25 mg by mouth daily.   atorvastatin 40 MG tablet Commonly known as: LIPITOR Take 40 mg by mouth daily.   cephALEXin 500 MG capsule Commonly known as: KEFLEX Take 1 capsule (500 mg total) by mouth every 12 (twelve) hours for 2 days.   dicyclomine 20 MG tablet Commonly known as: BENTYL Take 20 mg by mouth 2 (two) times daily.   MULTIVITAMIN PO Take 1 tablet by mouth daily.   pantoprazole 40 MG tablet Commonly known as: PROTONIX Take 40 mg by mouth 2 (two) times daily.   sucralfate 1 g tablet Commonly known as: CARAFATE Take 1 g by  mouth 4 (four) times daily.   Synthroid 100 MCG tablet Generic drug: levothyroxine Take 100 mcg by mouth daily before breakfast.   Vitamin D 50 MCG (2000 UT) tablet Take 2,000 Units by mouth daily.        Contact information for follow-up providers     Baxter Hire, MD. Schedule an appointment as soon as possible for a visit in 1 week(s).   Specialty: Internal Medicine Contact information: [place] [address] [phone]              Contact information for after-discharge care     Destination     [facility] .   Service: Skilled Nursing Contact information: [employer] [place] [phone]                    Discharge Exam: Danley Danker Weights   08/31/22 1316 09/03/22 0446  Weight: 84 kg 69.3 kg   General.  Frail elderly lady, in no acute distress. Pulmonary.  Lungs clear bilaterally, normal respiratory effort. CV.  Regular rate and rhythm, no JVD, rub or murmur. Abdomen.  Soft, nontender, nondistended, BS positive. CNS.  Alert and oriented to self only.  No focal neurologic deficit. Extremities.  No edema, no cyanosis, pulses intact and symmetrical. Psychiatry.  Judgment and insight  appears impaired.  Condition at discharge: stable  The results of significant diagnostics from this hospitalization (including imaging, microbiology, ancillary and laboratory) are listed below for reference.   Imaging Studies: US Venous Img Lower Bilateral (DVT)  Result Date: 09/01/2022 CLINICAL DATA:  Bilateral leg pain and swelling, initial encounter EXAM: BILATERAL LOWER EXTREMITY VENOUS DOPPLER ULTRASOUND TECHNIQUE: Gray-scale sonography with graded compression, as well as color Doppler and duplex ultrasound were performed to evaluate the lower extremity deep venous systems from the level of the common femoral vein and including the common femoral, femoral, profunda femoral, popliteal and  calf veins including the posterior tibial, peroneal and gastrocnemius veins when visible. The superficial great saphenous vein was also interrogated. Spectral Doppler was utilized to evaluate flow at rest and with distal augmentation maneuvers in the common femoral, femoral and popliteal veins. COMPARISON:  None Available. FINDINGS: RIGHT LOWER EXTREMITY Common Femoral Vein: No evidence of thrombus. Normal compressibility, respiratory phasicity and response to augmentation. Saphenofemoral Junction: No evidence of thrombus. Normal compressibility and flow on color Doppler imaging. Profunda Femoral Vein: No evidence of thrombus. Normal compressibility and flow on color Doppler imaging. Femoral Vein: No evidence of thrombus. Normal compressibility, respiratory phasicity and response to augmentation. Popliteal Vein: No evidence of thrombus. Normal compressibility, respiratory phasicity and response to augmentation. Calf Veins: No evidence of thrombus. Normal compressibility and flow on color Doppler imaging. Superficial Great Saphenous Vein: No evidence of thrombus. Normal compressibility. Venous Reflux:  None. Other Findings:  None. LEFT LOWER EXTREMITY Common Femoral Vein: No evidence of thrombus. Normal compressibility, respiratory phasicity and response to augmentation. Saphenofemoral Junction: No evidence of thrombus. Normal compressibility and flow on color Doppler imaging. Profunda Femoral Vein: No evidence of thrombus. Normal compressibility and flow on color Doppler imaging. Femoral Vein: No evidence of thrombus. Normal compressibility, respiratory phasicity and response to augmentation. Popliteal Vein: No evidence of thrombus. Normal compressibility, respiratory phasicity and response to augmentation. Calf Veins: No evidence of thrombus. Normal compressibility and flow on color Doppler imaging. Superficial Great Saphenous Vein: No evidence of thrombus. Normal compressibility. Venous Reflux:  None. Other  Findings:  None. IMPRESSION: No evidence of deep venous thrombosis in either lower extremity. Electronically Signed   By: Inez Catalina M.D.   On: 09/01/2022 02:12   CT Angio Chest Pulmonary Embolism (PE) W or WO Contrast  Result Date: 09/01/2022 CLINICAL DATA:  Altered mental status and edema to both lower extremities. PE suspected high probability EXAM: CT ANGIOGRAPHY CHEST WITH CONTRAST TECHNIQUE: Multidetector CT imaging of the chest was performed using the standard protocol during bolus administration of intravenous contrast. Multiplanar CT image reconstructions and MIPs were obtained to evaluate the vascular anatomy. RADIATION DOSE REDUCTION: This exam was performed according to the departmental dose-optimization program which includes automated exposure control, adjustment of the mA and/or kV according to patient size and/or use of iterative reconstruction technique. CONTRAST:  35m OMNIPAQUE IOHEXOL 350 MG/ML SOLN COMPARISON:  radiographs earlier today and CT chest 06/07/2017 FINDINGS: Cardiovascular: Satisfactory opacification of the pulmonary arteries to the segmental level. No evidence of pulmonary embolism. No pericardial effusion. Aortic valve and aortic arch atherosclerotic calcification. Coronary artery calcification. Mediastinum/Nodes: No thoracic adenopathy by size. Unremarkable esophagus. Lungs/Pleura: No pleural effusion or pneumothorax. Patchy centrilobular micro nodules and ground-glass opacities bilaterally greatest in the right upper lobe where there is areas of more confluence nodularity and bronchiolectasis (circa series 6/image 47). Mild interlobular septal thickening. Pleural-parenchymal scarring with marked bronchiolectasis in the anteromedial left upper lobe. Scattered pulmonary nodules measuring  up to 6 mm in the posterior right lower lobe (6/65 and 5 mm in the posterior right lower lobe (6/70). These are not significantly changed from 2018 and should be benign. Upper Abdomen: No  acute abnormality. Musculoskeletal: No chest wall abnormality. No acute osseous findings. Review of the MIP images confirms the above findings. IMPRESSION: 1. Negative for acute pulmonary embolism. 2. Constellation of findings compatible with infectious or inflammatory bronchiolitis. 3. Coronary artery and Aortic Atherosclerosis (ICD10-I70.0). Electronically Signed   By: Placido Sou M.D.   On: 09/01/2022 00:22   CT Head Wo Contrast  Result Date: 08/31/2022 CLINICAL DATA:  Mental status change, unknown cause. EXAM: CT HEAD WITHOUT CONTRAST TECHNIQUE: Contiguous axial images were obtained from the base of the skull through the vertex without intravenous contrast. RADIATION DOSE REDUCTION: This exam was performed according to the departmental dose-optimization program which includes automated exposure control, adjustment of the mA and/or kV according to patient size and/or use of iterative reconstruction technique. COMPARISON:  12/12/2020 FINDINGS: Brain: Normal anatomic configuration. Moderate parenchymal volume loss is commensurate with the patient's age. Mild periventricular white matter changes are present likely reflecting the sequela of small vessel ischemia. No abnormal intra or extra-axial mass lesion or fluid collection. No abnormal mass effect or midline shift. No evidence of acute intracranial hemorrhage or infarct. Ventricular size is normal. Cerebellum unremarkable. Vascular: No asymmetric hyperdense vasculature at the skull base. Skull: Intact Sinuses/Orbits: Paranasal sinuses are clear. Orbits are unremarkable. Other: Mastoid air cells and middle ear cavities are clear. IMPRESSION: 1. No acute intracranial hemorrhage or infarct. 2. Mild senescent change. Electronically Signed   By: Fidela Salisbury M.D.   On: 08/31/2022 18:38   DG Chest 1 View  Result Date: 08/31/2022 CLINICAL DATA:  Altered mental status, peripheral edema EXAM: CHEST  1 VIEW COMPARISON:  08/22/2018 FINDINGS: Lungs are well  expanded, symmetric, and clear. No pneumothorax or pleural effusion. Cardiac size within normal limits. Pulmonary vascularity is normal. Osseous structures are age-appropriate. No acute bone abnormality. IMPRESSION: No active disease. Electronically Signed   By: Fidela Salisbury M.D.   On: 08/31/2022 18:35    Microbiology: Results for orders placed or performed during the hospital encounter of 08/31/22  Urine Culture     Status: Abnormal   Collection Time: 08/31/22  2:18 PM   Specimen: Urine, Clean Catch  Result Value Ref Range Status   Specimen Description   Final    URINE, CLEAN CATCH Performed at [facility], [address] [place], [place] [address]    Special Requests   Final    NONE Performed at [facility], [address], [place], [place] [address]    Culture 10,000 COLONIES/mL PROTEUS MIRABILIS (A)  Final   Report Status 09/03/2022 FINAL  Final   Organism ID, Bacteria PROTEUS MIRABILIS (A)  Final      Susceptibility   Proteus mirabilis - MIC*    AMPICILLIN RESISTANT Resistant     CEFAZOLIN <=4 SENSITIVE Sensitive     CEFEPIME <=0.12 SENSITIVE Sensitive     CEFTRIAXONE <=0.25 SENSITIVE Sensitive     CIPROFLOXACIN <=0.25 SENSITIVE Sensitive     GENTAMICIN <=1 SENSITIVE Sensitive     IMIPENEM 1 SENSITIVE Sensitive     NITROFURANTOIN 128 RESISTANT Resistant     TRIMETH/SULFA >=320 RESISTANT Resistant     AMPICILLIN/SULBACTAM <=2 SENSITIVE Sensitive     PIP/TAZO <=4 SENSITIVE Sensitive     * 10,000 COLONIES/mL PROTEUS MIRABILIS    Labs: CBC: Recent Labs  Lab 08/31/22 1418 09/01/22 0646  WBC 7.2 10.8*  HGB 13.9 14.9  HCT 41.7 45.7  MCV 96.1 99.6  PLT 246 517   Basic Metabolic Panel: Recent Labs  Lab 08/31/22 1418 08/31/22 1802 09/01/22 0646 09/02/22 1210 09/03/22 0733 09/04/22 0538  NA 139  --  139 141 140 140  K 3.2*  --  3.5 3.6 5.4* 4.5  CL 107  --  109 110 111 109  CO2 26  --  21* '26 23 23  '$ GLUCOSE 138*  --  127* 93 78 76  BUN  19  --  '15 15 16 18  '$ CREATININE 0.94  --  0.86 0.86 0.91 1.03*  CALCIUM 9.6  --  9.3 9.2 9.2 9.4  MG  --  1.7  --  1.7 2.4 2.2   Liver Function Tests: Recent Labs  Lab 08/31/22 1418 09/01/22 0646  AST 38 40  ALT 25 25  ALKPHOS 110 116  BILITOT 1.5* 1.3*  PROT 6.9 7.2  ALBUMIN 3.3* 3.2*   CBG: No results for input(s): "GLUCAP" in the last 168 hours.  Discharge time spent: greater than 30 minutes.  This record has been created using Systems analyst. Errors have been sought and corrected,but may not always be located. Such creation errors do not reflect on the standard of care.   Signed: Lorella Nimrod, MD [facility] 09/04/2022

## 2022-09-04 NOTE — Plan of Care (Signed)

## 2022-09-05 NOTE — Evaluation (Signed)
Physical Therapy Evaluation Patient Details Name: Jeanine Caven MRN: [id number] DOB: 02-May-1935 Today's Date: 09/05/2022  History of Present Illness  87 y.o. female with past medical history significant for peripheral neuropathy, hypothyroidism, and hyperlipidemia brought to emergency room by a friend from retirement community for concerning complaints of altered mental status and peripheral edema, no diuretic use.  Clinical Impression  Pt pleasant but anxious with considerable confusion t/o session  She needed consistent and repeated cues but was able to participate with this constant reinforcement.  Pt showed ability to ambulate ~80 ft with slow, deliberate gait using walker and with plenty of cuing.  Per chart review and reports she is not at her much more functional baseline, will benefit from continued PT to address these functional limitations and progress back to PLOF.       Recommendations for follow up therapy are one component of a multi-disciplinary discharge planning process, led by the attending physician.  Recommendations may be updated based on patient status, additional functional criteria and insurance authorization.  Follow Up Recommendations Skilled nursing-short term rehab (<3 hours/day) Can patient physically be transported by private vehicle: Yes    Assistance Recommended at Discharge Frequent or constant Supervision/Assistance  Patient can return home with the following       Equipment Recommendations None recommended by PT  Recommendations for Other Services       Functional Status Assessment Patient has had a recent decline in their functional status and demonstrates the ability to make significant improvements in function in a reasonable and predictable amount of time.     Precautions / Restrictions Precautions Precautions: Fall Restrictions Weight Bearing Restrictions: No      Mobility  Bed Mobility Overal bed mobility: Needs Assistance Bed Mobility: Supine  to Sit     Supine to sit: Mod assist     General bed mobility comments: Pt struggled to initiate movement toward EOB when cued, did give some assist once PT helped/initiated movement    Transfers Overall transfer level: Needs assistance Equipment used: Rolling walker (2 wheels) Transfers: Sit to/from Stand Sit to Stand: Min assist           General transfer comment: Pt again struggled to initiate, but once PT helped to initiate upward movement she was able to assist and needed only minimal assist to insure sustained effort to standing with walker    Ambulation/Gait Ambulation/Gait assistance: Min guard Gait Distance (Feet): 80 Feet Assistive device: Rolling walker (2 wheels)         General Gait Details: Pt needing consistent cuing (directional, walker use, maintaining forward momemtum) but did do relatively well during straight-aways as she was able to sustain cadence w/o needing to turn, etc  Stairs            Wheelchair Mobility    Modified Rankin (Stroke Patients Only)       Balance Overall balance assessment: Needs assistance Sitting-balance support: No upper extremity supported Sitting balance-Leahy Scale: Good     Standing balance support: Bilateral upper extremity supported Standing balance-Leahy Scale: Fair Standing balance comment: need for constant cuing to use/position walker appropriately -  no LOBs but safety awareness limited                             Pertinent Vitals/Pain Pain Assessment Pain Assessment: No/denies pain    Home Living Family/patient expects to be discharged to:: Skilled nursing facility  Additional Comments: resident at [facility])    Prior Function Prior Level of Function : Patient poor historian/Family not available             Mobility Comments: per chart review, pt with more ambulation and appropraitly interactive last year       Hand Dominance         Extremity/Trunk Assessment   Upper Extremity Assessment Upper Extremity Assessment: Difficult to assess due to impaired cognition    Lower Extremity Assessment Lower Extremity Assessment: Difficult to assess due to impaired cognition       Communication      Cognition Arousal/Alertness: Awake/alert Behavior During Therapy: Restless Overall Cognitive Status: Impaired/Different from baseline                                 General Comments: Pt needed consistent and repeated cuing - difficult to keep on task.  Pt able to state her name but otherwise showed lack of awareness        General Comments      Exercises     Assessment/Plan    PT Assessment Patient needs continued PT services  PT Problem List Decreased activity tolerance;Decreased knowledge of use of DME;Decreased safety awareness;Decreased mobility;Decreased balance;Decreased cognition       PT Treatment Interventions DME instruction;Gait training;Functional mobility training;Therapeutic activities;Therapeutic exercise;Balance training;Cognitive remediation;Patient/family education    PT Goals (Current goals can be found in the Care Plan section)  Acute Rehab PT Goals Patient Stated Goal: pt unable to state PT Goal Formulation: With patient Time For Goal Achievement: 09/25/22 Potential to Achieve Goals: Fair    Frequency Min 2X/week     Co-evaluation               AM-PAC PT "6 Clicks" Mobility  Outcome Measure Help needed turning from your back to your side while in a flat bed without using bedrails?: A Little Help needed moving from lying on your back to sitting on the side of a flat bed without using bedrails?: A Little Help needed moving to and from a bed to a chair (including a wheelchair)?: A Little Help needed standing up from a chair using your arms (e.g., wheelchair or bedside chair)?: A Little Help needed to walk in hospital room?: A Lot Help needed climbing 3-5 steps with a  railing? : Total 6 Click Score: 15    End of Session Equipment Utilized During Treatment: Gait belt Activity Tolerance: Patient tolerated treatment well Patient left: with chair alarm set;with call bell/phone within reach Nurse Communication: Mobility status PT Visit Diagnosis: Muscle weakness (generalized) (M62.81);Difficulty in walking, not elsewhere classified (R26.2)    Time: 1950-9326 PT Time Calculation (min) (ACUTE ONLY): 21 min   Charges:   PT Evaluation $PT Eval Low Complexity: 1 Low          Kreg Shropshire, DPT 09/05/2022, 1:19 PM

## 2022-09-05 NOTE — Discharge Summary (Signed)
Physician Discharge Summary   Patient: Jasmine Butler MRN: [id number] DOB: Jan 29, 1935  Admit date:     08/31/2022  Discharge date: 09/06/22  Discharge Physician: Lorella Nimrod   PCP: Baxter Hire, MD   Recommendations at discharge:  Please obtain CBC and BMP in 1 week Follow-up with primary care provider within a week Ensure completion of antibiotics Patient need encouragement to eat and drink-please encourage p.o. hydration  Discharge Diagnoses: Principal Problem:   AMS (altered mental status) Active Problems:   UTI (urinary tract infection)   Bronchiolitis   Hypothyroid   Prolonged QT interval   Hypokalemia   Hyperkalemia   Hospital Course: Taken H&P.  Jasmine Butler is an 87 y.o. female with past medical history significant for peripheral neuropathy, hypothyroidism, and hyperlipidemia brought to emergency room by a friend from retirement community for concerning complaints of altered mental status and peripheral edema, no diuretic use. No reported headaches blurred vision speech or gait issues or falls.  Patient was unable to participate with any further history.  ED course.  Afebrile, mild tachycardia and tachypnea, labs pertinent for potassium of 3.2 and T. bili of 1.5.  Troponin 39>46,BNP 193, TSH elevated at 6.765, free T4 within normal limit at 0.76, ammonia within normal limit, D-dimer at 0.72 which is within normal limit if corrected for age, CTA was done in the ED which was negative for PE, did show patchy centrilobular micronodules and groundglass opacities bilaterally greater in the right upper lobe, mild interlobular septal thickening, scarring with mild bronchiectasis, scattered pulmonary nodules measuring up to 6 mm in the posterior right lobe which was not significantly changed from his prior CT done in 2018 so should be benign.  These findings can be compatible with infectious or inflammatory bronchiolitis.  No DVT on lower extremity venous Doppler. UA with leukocytosis and  bacteriuria. She was started on ceftriaxone for concern of UTI  11/29: Hemodynamically stable, overnight agitation requiring Ativan.  History of prolonged QTc Ordered urine cultures as add-on.  CBC with leukocytosis at 10.8. PCT <0.10 Patient remained encephalopathic and unable to participate with any meaningful history.  11/30: Patient remained hemodynamically stable, overnight agitation, requiring sitter and still not been able to participate with any meaningful history.  Urine cultures with Proteus Mirabilis-pending susceptibility.  12/1: Hemodynamically stable.  Mentation improving and she started taking p.o.  Urine cultures with Proteus Mirabilis,  which shows resistance to ampicillin, nitrofurantoin and Bactrim.  Labs pertinent for potassium of 5.4-ordered 1 dose of Lokelma.  Antibiotics switched with Keflex.  12/2: Patient with significant improvement in mentation.  Able to eat and drink.  No agitation and able to participate with care.  Hyperkalemia resolved. Patient is being discharged on Keflex for 2 more days. [facility] living.  12/3: Patient was medically stable for discharge yesterday, apparently the facility refused to take her yesterday as she was not off from sitter use for 24 hours. Patient remained stable.  Completed the course of antibiotics so no need to continue.  She need assistance with feeding.  No sitter for more than 24 hours.  Still oriented to self which is her baseline.  No agitation and following commands.  12/4: Patient was discharged yesterday.  Did not qualify for EMS and family would like to transport privately which will happen today as her daughter was flying in from out of town.  She will continue the rest of her home medications and follow-up with  her providers for further recommendations.   Assessment and Plan: * AMS (altered mental status) Attribute to urinary tract  infection.  CT head negative for any acute intracranial abnormality.  Mentation improving and she started taking p.o. As needed Ativan. Neurochecks. Sitter at bedside as needed.  UTI (urinary tract infection) UA with Leukocytosis and bacteriuria.  Unable to explain any urinary symptoms. Urine cultures with 10,000 colonies of Proteus mirabilis-only showed resistant to ampicillin, nitrofurantoin and Bactrim.  Procalcitonin negative. -Switch to Keflex to complete a 5-day course    Bronchiolitis Cont rocephin and PRN albuterol.   Hypothyroid Patient had mildly elevated TSH with normal free T4 Continue patient's home regimen of levothyroxine at 100 mcg. Need to repeat levels in 3 to 4 weeks after this acute illness by PCP  Prolonged QT interval  EKG shows prolonged QT.  Will avoid medications that will potentiate or worsen. Keep mag level,corrected.   Hypokalemia Became hyperkalemic after getting replacement. -Continue to monitor  Hyperkalemia Most likely iatrogenic as she received replacement for hypokalemia. Potassium at 5.4. -Giving 1 dose of Lokelma -Monitor potassium   Consultants: None Procedures performed: None Disposition: Skilled nursing facility Diet recommendation:  Discharge Diet Orders (From admission, onward)     Start     Ordered   09/04/22 0000  Diet - low sodium heart healthy        09/04/22 1124           Regular diet DISCHARGE MEDICATION: Allergies as of 09/06/2022       Reactions   Cefuroxime Axetil Diarrhea   Ciprofloxacin Hives   Other    Chicken/poultry   Shellfish Allergy         Medication List     STOP taking these medications    ammonium lactate 12 % cream Commonly known as: AMLACTIN   amoxicillin 500 MG capsule Commonly known as: AMOXIL   clarithromycin 500 MG tablet Commonly known as: BIAXIN   clotrimazole-betamethasone cream Commonly known as: LOTRISONE       TAKE these medications    atenolol 25 MG  tablet Commonly known as: TENORMIN Take 25 mg by mouth daily.   atorvastatin 40 MG tablet Commonly known as: LIPITOR Take 40 mg by mouth daily.   dicyclomine 20 MG tablet Commonly known as: BENTYL Take 20 mg by mouth 2 (two) times daily.   MULTIVITAMIN PO Take 1 tablet by mouth daily.   pantoprazole 40 MG tablet Commonly known as: PROTONIX Take 40 mg by mouth 2 (two) times daily.   sucralfate 1 g tablet Commonly known as: CARAFATE Take 1 g by mouth 4 (four) times daily.   Synthroid 100 MCG tablet Generic drug: levothyroxine Take 100 mcg by mouth daily before breakfast.   Vitamin D 50 MCG (2000 UT) tablet Take 2,000 Units by mouth daily.        Contact information for follow-up providers     Baxter Hire, MD. Schedule an appointment as soon as possible for a visit in 1 week(s).   Specialty: Internal Medicine Contact information: [place] [address] [phone]              Contact information for after-discharge care     Destination     [facility] .   Service: Skilled Nursing Contact information: [place] [phone]                    Discharge Exam:  Filed Weights   08/31/22 1316 09/03/22 0446 09/06/22 0438  Weight: 84 kg 69.3 kg 68.6 kg   General.  Frail elderly lady, in no acute distress. Pulmonary.  Lungs clear bilaterally, normal respiratory effort. CV.  Regular rate and rhythm, no JVD, rub or murmur. Abdomen.  Soft, nontender, nondistended, BS positive. CNS.  Alert and oriented to self only.  No focal neurologic deficit. Extremities.  No edema, no cyanosis, pulses intact and symmetrical. Psychiatry.  Judgment and insight appears impaired.  Condition at discharge: stable  The results of significant diagnostics from this hospitalization (including imaging, microbiology, ancillary and laboratory) are listed below for reference.   Imaging  Studies: US Venous Img Lower Bilateral (DVT)  Result Date: 09/01/2022 CLINICAL DATA:  Bilateral leg pain and swelling, initial encounter EXAM: BILATERAL LOWER EXTREMITY VENOUS DOPPLER ULTRASOUND TECHNIQUE: Gray-scale sonography with graded compression, as well as color Doppler and duplex ultrasound were performed to evaluate the lower extremity deep venous systems from the level of the common femoral vein and including the common femoral, femoral, profunda femoral, popliteal and calf veins including the posterior tibial, peroneal and gastrocnemius veins when visible. The superficial great saphenous vein was also interrogated. Spectral Doppler was utilized to evaluate flow at rest and with distal augmentation maneuvers in the common femoral, femoral and popliteal veins. COMPARISON:  None Available. FINDINGS: RIGHT LOWER EXTREMITY Common Femoral Vein: No evidence of thrombus. Normal compressibility, respiratory phasicity and response to augmentation. Saphenofemoral Junction: No evidence of thrombus. Normal compressibility and flow on color Doppler imaging. Profunda Femoral Vein: No evidence of thrombus. Normal compressibility and flow on color Doppler imaging. Femoral Vein: No evidence of thrombus. Normal compressibility, respiratory phasicity and response to augmentation. Popliteal Vein: No evidence of thrombus. Normal compressibility, respiratory phasicity and response to augmentation. Calf Veins: No evidence of thrombus. Normal compressibility and flow on color Doppler imaging. Superficial Great Saphenous Vein: No evidence of thrombus. Normal compressibility. Venous Reflux:  None. Other Findings:  None. LEFT LOWER EXTREMITY Common Femoral Vein: No evidence of thrombus. Normal compressibility, respiratory phasicity and response to augmentation. Saphenofemoral Junction: No evidence of thrombus. Normal compressibility and flow on color Doppler imaging. Profunda Femoral Vein: No evidence of thrombus. Normal  compressibility and flow on color Doppler imaging. Femoral Vein: No evidence of thrombus. Normal compressibility, respiratory phasicity and response to augmentation. Popliteal Vein: No evidence of thrombus. Normal compressibility, respiratory phasicity and response to augmentation. Calf Veins: No evidence of thrombus. Normal compressibility and flow on color Doppler imaging. Superficial Great Saphenous Vein: No evidence of thrombus. Normal compressibility. Venous Reflux:  None. Other Findings:  None. IMPRESSION: No evidence of deep venous thrombosis in either lower extremity. Electronically Signed   By: Inez Catalina M.D.   On: 09/01/2022 02:12   CT Angio Chest Pulmonary Embolism (PE) W or WO Contrast  Result Date: 09/01/2022 CLINICAL DATA:  Altered mental status and edema to both lower extremities. PE suspected high probability EXAM: CT ANGIOGRAPHY CHEST WITH CONTRAST TECHNIQUE: Multidetector CT imaging of the chest was performed using the standard protocol during bolus administration of intravenous contrast. Multiplanar CT image reconstructions and MIPs were obtained to evaluate the vascular anatomy. RADIATION DOSE REDUCTION: This exam was performed according to the departmental dose-optimization program which includes automated exposure control, adjustment of the mA and/or kV according to patient size and/or use of iterative reconstruction technique. CONTRAST:  72m OMNIPAQUE IOHEXOL 350 MG/ML SOLN COMPARISON:  radiographs earlier today and CT chest 06/07/2017 FINDINGS: Cardiovascular: Satisfactory opacification of the pulmonary arteries  to the segmental level. No evidence of pulmonary embolism. No pericardial effusion. Aortic valve and aortic arch atherosclerotic calcification. Coronary artery calcification. Mediastinum/Nodes: No thoracic adenopathy by size. Unremarkable esophagus. Lungs/Pleura: No pleural effusion or pneumothorax. Patchy centrilobular micro nodules and ground-glass opacities bilaterally  greatest in the right upper lobe where there is areas of more confluence nodularity and bronchiolectasis (circa series 6/image 47). Mild interlobular septal thickening. Pleural-parenchymal scarring with marked bronchiolectasis in the anteromedial left upper lobe. Scattered pulmonary nodules measuring up to 6 mm in the posterior right lower lobe (6/65 and 5 mm in the posterior right lower lobe (6/70). These are not significantly changed from 2018 and should be benign. Upper Abdomen: No acute abnormality. Musculoskeletal: No chest wall abnormality. No acute osseous findings. Review of the MIP images confirms the above findings. IMPRESSION: 1. Negative for acute pulmonary embolism. 2. Constellation of findings compatible with infectious or inflammatory bronchiolitis. 3. Coronary artery and Aortic Atherosclerosis (ICD10-I70.0). Electronically Signed   By: Placido Sou M.D.   On: 09/01/2022 00:22   CT Head Wo Contrast  Result Date: 08/31/2022 CLINICAL DATA:  Mental status change, unknown cause. EXAM: CT HEAD WITHOUT CONTRAST TECHNIQUE: Contiguous axial images were obtained from the base of the skull through the vertex without intravenous contrast. RADIATION DOSE REDUCTION: This exam was performed according to the departmental dose-optimization program which includes automated exposure control, adjustment of the mA and/or kV according to patient size and/or use of iterative reconstruction technique. COMPARISON:  12/12/2020 FINDINGS: Brain: Normal anatomic configuration. Moderate parenchymal volume loss is commensurate with the patient's age. Mild periventricular white matter changes are present likely reflecting the sequela of small vessel ischemia. No abnormal intra or extra-axial mass lesion or fluid collection. No abnormal mass effect or midline shift. No evidence of acute intracranial hemorrhage or infarct. Ventricular size is normal. Cerebellum unremarkable. Vascular: No asymmetric hyperdense vasculature at  the skull base. Skull: Intact Sinuses/Orbits: Paranasal sinuses are clear. Orbits are unremarkable. Other: Mastoid air cells and middle ear cavities are clear. IMPRESSION: 1. No acute intracranial hemorrhage or infarct. 2. Mild senescent change. Electronically Signed   By: Fidela Salisbury M.D.   On: 08/31/2022 18:38   DG Chest 1 View  Result Date: 08/31/2022 CLINICAL DATA:  Altered mental status, peripheral edema EXAM: CHEST  1 VIEW COMPARISON:  08/22/2018 FINDINGS: Lungs are well expanded, symmetric, and clear. No pneumothorax or pleural effusion. Cardiac size within normal limits. Pulmonary vascularity is normal. Osseous structures are age-appropriate. No acute bone abnormality. IMPRESSION: No active disease. Electronically Signed   By: Fidela Salisbury M.D.   On: 08/31/2022 18:35    Microbiology: Results for orders placed or performed during the hospital encounter of 08/31/22  Urine Culture     Status: Abnormal   Collection Time: 08/31/22  2:18 PM   Specimen: Urine, Clean Catch  Result Value Ref Range Status   Specimen Description   Final    URINE, CLEAN CATCH Performed at [facility], [address], [place], [place] [address]    Special Requests   Final    NONE Performed at [facility], [address] [place], [place] [address]    Culture 10,000 COLONIES/mL PROTEUS MIRABILIS (A)  Final   Report Status 09/03/2022 FINAL  Final   Organism ID, Bacteria PROTEUS MIRABILIS (A)  Final      Susceptibility   Proteus mirabilis - MIC*    AMPICILLIN RESISTANT Resistant     CEFAZOLIN <=4 SENSITIVE Sensitive     CEFEPIME <=  0.12 SENSITIVE Sensitive     CEFTRIAXONE <=0.25 SENSITIVE Sensitive     CIPROFLOXACIN <=0.25 SENSITIVE Sensitive     GENTAMICIN <=1 SENSITIVE Sensitive     IMIPENEM 1 SENSITIVE Sensitive     NITROFURANTOIN 128 RESISTANT Resistant     TRIMETH/SULFA >=320 RESISTANT Resistant     AMPICILLIN/SULBACTAM <=2 SENSITIVE Sensitive     PIP/TAZO <=4 SENSITIVE  Sensitive     * 10,000 COLONIES/mL PROTEUS MIRABILIS    Labs: CBC: Recent Labs  Lab 08/31/22 1418 09/01/22 0646  WBC 7.2 10.8*  HGB 13.9 14.9  HCT 41.7 45.7  MCV 96.1 99.6  PLT 246 003   Basic Metabolic Panel: Recent Labs  Lab 08/31/22 1418 08/31/22 1802 09/01/22 0646 09/02/22 1210 09/03/22 0733 09/04/22 0538  NA 139  --  139 141 140 140  K 3.2*  --  3.5 3.6 5.4* 4.5  CL 107  --  109 110 111 109  CO2 26  --  21* '26 23 23  '$ GLUCOSE 138*  --  127* 93 78 76  BUN 19  --  '15 15 16 18  '$ CREATININE 0.94  --  0.86 0.86 0.91 1.03*  CALCIUM 9.6  --  9.3 9.2 9.2 9.4  MG  --  1.7  --  1.7 2.4 2.2   Liver Function Tests: Recent Labs  Lab 08/31/22 1418 09/01/22 0646  AST 38 40  ALT 25 25  ALKPHOS 110 116  BILITOT 1.5* 1.3*  PROT 6.9 7.2  ALBUMIN 3.3* 3.2*   CBG: No results for input(s): "GLUCAP" in the last 168 hours.  Discharge time spent: greater than 30 minutes.  This record has been created using Systems analyst. Errors have been sought and corrected,but may not always be located. Such creation errors do not reflect on the standard of care.   Signed: Lorella Nimrod, MD [facility] 09/06/2022

## 2022-09-05 NOTE — Plan of Care (Signed)

## 2022-09-05 NOTE — TOC Transition Note (Addendum)
Transition of Care [facility]) - CM/SW Discharge Note   Patient Details  Name: Jasmine Butler MRN: [id number] Date of Birth: April 08, 1935  Transition of Care [facility]) CM/SW Contact:  Izola Price, RN Phone Number: 09/05/2022, 1:05 PM   Clinical Narrative:  Left VM for Loni Dolly, daughter, at [phone] of impending discharge and transfer to Room 108 at [facility]. [name] RN CM   UPDATE: 2 pm. PT notes inboxed to [facility] via [place]. Patient does not qualify or EMS transport and can travel via private vehicle. Family members informed but both are out of state traveling back to [facility]. They will make arrangements Monday to transport patient to facility. AC notified at facility. Barrier is transportation for discharge today. [name] RN CM     Final next level of care: Skilled Nursing Facility Barriers to Discharge: Barriers Resolved   Patient Goals and CMS Choice   CMS Medicare.gov Compare Post Acute Care list provided to:: Other (Comment Required) (Sisters per prior CM notes on placement) Choice offered to / list presented to : Sibling  Discharge Placement              Patient chooses bed at: [facility] Patient to be transferred to facility by: EMS Name of family member notified: Loni Dolly via Voice Mail at 105 PM. 09/05/22. Patient and family notified of of transfer: 09/05/22  Discharge Plan and Services   Discharge Planning Services: CM Consult            DME Arranged: N/A DME Agency: NA       HH Arranged: NA HH Agency: NA        Social Determinants of Health (SDOH) Interventions     Readmission Risk Interventions     No data to display

## 2022-09-05 NOTE — TOC Transition Note (Addendum)
Transition of Care [facility]) - CM/SW Discharge Note   Patient Details  Name: Jasmine Butler MRN: [id number] Date of Birth: 15-Dec-1934  Transition of [facility]) CM/SW Contact:  Izola Price, RN Phone Number: 09/05/2022, 9:54 AM   Clinical Narrative: 12/3: [name] can accept patient today around 4 pm per [facility]. DC Summary and therapy notes inboxed via [facility] at 955 am. Updated [name]. Via secure message. PT/OT evaluations ordered prior to physical discharge to the STR/SNF requirements for rehab. [name] RN CM    Patient will be admitted to Room 108 at [facility]. Report to be called to [phone]. Updated Unit RN. [name] RN CM      Barriers to Discharge: Requiring sitter/restraints (Must be sitter free through tonight per [facility] and then can admit patient Sunday 09/05/22. BBell RN CM)   Patient Goals and CMS Choice        Discharge Placement                       Discharge Plan and Services   Discharge Planning Services: CM Consult            DME Arranged: N/A DME Agency: NA                  Social Determinants of Health ([place]) Interventions     Readmission Risk Interventions     No data to display

## 2022-09-05 NOTE — Progress Notes (Signed)
Speech Language Pathology Treatment:    Patient Details Name: Jasmine Butler MRN: [id number] DOB: 12-21-1934 Today's Date: 09/05/2022 Time: 0900-0910 SLP Time Calculation (min) (ACUTE ONLY): 10 min  Assessment / Plan / Recommendation Clinical Impression  Pt seen for diet tolerance/clinical swallowing re-evaluation. Pt alert. Notable confused. Intermittent BUE tremors noted. Assistance with feeding needed during tx.  Pt given trials of solid (1/4 graham cracker), pureed (x5 bites pudding), and thin liquids (~3 oz water via cup and straw). Pt continues to present with s/sx mild-moderate oral dysphagia c/b prolonged/inefficient mastication of solid with limited rotary jaw movement. Oral deficits likely due to dental status and exacerbated by AMS. Pharyngeal swallow appeared [facility] per clinical assessment.   Per chart review, temp WNL. No recent WBC or chest imaging. Pt on room air.   Pt remains at increased risk of aspiration/aspiration PNA given dental status (missing dentition), mental status, need for assistance with feeding, and medical comorbidities.   Recommend continuation of a pureed diet with thin liquids with safe swallowing strategies/aspiration precautions as outlined below.   SLP to continue to f/u per POC for diet tolerance/clinical swallowing re-evaluation pending improvements in overall mental status.  RN made aware of results, recommendations, and SLP POC. Pt also made aware; however, suspect limited understanding due to AMS.   HPI HPI: Per H&P "Jasmine Butler is an 87 y.o. female brought to emergency room by a friend presents with [facility] for concerning complaints of altered mental status and peripheral edema, no diuretic use.  No reported headaches blurred vision speech or gait issues or falls.  During my exam patient is awake delirious oriented to her name.  Patient states she is loopy."      SLP Plan  Continue with current plan of care      Recommendations for follow  up therapy are one component of a multi-disciplinary discharge planning process, led by the attending physician.  Recommendations may be updated based on patient status, additional functional criteria and insurance authorization.    Recommendations  Diet recommendations: Dysphagia 1 (puree);Thin liquid Liquids provided via: Cup;Straw Medication Administration: Crushed with puree (vs whole with puree) Supervision: Full supervision/cueing for compensatory strategies;Trained caregiver to feed patient (assistance with feeding PRN) Compensations: Minimize environmental distractions;Slow rate;Small sips/bites Postural Changes and/or Swallow Maneuvers: Seated upright 90 degrees;Upright 30-60 min after meal                Oral Care Recommendations: Oral care QID Follow Up Recommendations: Skilled nursing-short term rehab (<3 hours/day) Assistance recommended at discharge: Frequent or constant Supervision/Assistance SLP Visit Diagnosis: Dysphagia, oral phase (R13.11) Plan: Continue with current plan of care          Cherrie Gauze, M.S., [facility] [phone] ([place])   Quintella Baton  09/05/2022, 9:22 AM

## 2022-09-06 NOTE — TOC Progression Note (Addendum)
Transition of Care [facility]) - CM/SW Discharge Note   Patient Details  Name: Jasmine Butler MRN: [id number] Date of Birth: 10-Aug-1935  Transition of Care [facility]) CM/SW Contact:  Conception Oms, RN Phone Number: 09/06/2022, 9:44 AM   Clinical Narrative:     Called Daughter Wells Guiles and left a voice mail, Anderson Malta [phone] Martin Majestic to the room and her daughter Anderson Malta is in the room, She will transport the patient to room 108 at [facility]  Final next level of care: [place] Barriers to Discharge: Transportation (Does not qualify for EMS transport and family flying in [place]. Will transport to facility tomorrow.)   Patient Goals and CMS Choice   CMS Medicare.gov Compare Post Acute Care list provided to:: Other (Comment Required) (Sisters per prior CM notes on placement) Choice offered to / list presented to : Sibling  Discharge Placement              Patient chooses bed at: [facility] Patient to be transferred to facility by: EMS Name of family member notified: Loni Dolly via Voice Mail at 105 PM. 09/05/22. Patient and family notified of of transfer: 09/05/22  Discharge Plan and Services   Discharge Planning Services: CM Consult            DME Arranged: N/A DME Agency: NA       HH Arranged: NA HH Agency: NA        Social Determinants of Health (SDOH) Interventions     Readmission Risk Interventions     No data to display

## 2022-09-08 ENCOUNTER — Non-Acute Institutional Stay (SKILLED_NURSING_FACILITY): Payer: Medicare Other | Admitting: Student

## 2022-09-08 ENCOUNTER — Encounter: Payer: Self-pay | Admitting: Student

## 2022-09-08 DIAGNOSIS — N3001 Acute cystitis with hematuria: Secondary | ICD-10-CM

## 2022-09-08 DIAGNOSIS — I8390 Asymptomatic varicose veins of unspecified lower extremity: Secondary | ICD-10-CM | POA: Diagnosis not present

## 2022-09-08 DIAGNOSIS — I340 Nonrheumatic mitral (valve) insufficiency: Secondary | ICD-10-CM

## 2022-09-08 DIAGNOSIS — I10 Essential (primary) hypertension: Secondary | ICD-10-CM | POA: Diagnosis not present

## 2022-09-08 DIAGNOSIS — R410 Disorientation, unspecified: Secondary | ICD-10-CM

## 2022-09-08 DIAGNOSIS — J479 Bronchiectasis, uncomplicated: Secondary | ICD-10-CM

## 2022-09-08 DIAGNOSIS — E039 Hypothyroidism, unspecified: Secondary | ICD-10-CM

## 2022-09-08 DIAGNOSIS — I6523 Occlusion and stenosis of bilateral carotid arteries: Secondary | ICD-10-CM | POA: Diagnosis not present

## 2022-09-08 DIAGNOSIS — Z66 Do not resuscitate: Secondary | ICD-10-CM

## 2022-09-08 DIAGNOSIS — I071 Rheumatic tricuspid insufficiency: Secondary | ICD-10-CM

## 2022-09-08 DIAGNOSIS — N1832 Chronic kidney disease, stage 3b: Secondary | ICD-10-CM
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
## 2022-09-28 ENCOUNTER — Non-Acute Institutional Stay (SKILLED_NURSING_FACILITY): Payer: Medicare Other | Admitting: Adult Health

## 2022-09-28 ENCOUNTER — Encounter: Payer: Self-pay | Admitting: Adult Health

## 2022-09-28 DIAGNOSIS — I10 Essential (primary) hypertension: Secondary | ICD-10-CM

## 2022-09-28 DIAGNOSIS — R350 Frequency of micturition: Secondary | ICD-10-CM | POA: Diagnosis not present

## 2022-09-28 DIAGNOSIS — N1831 Chronic kidney disease, stage 3a: Secondary | ICD-10-CM

## 2022-09-28 DIAGNOSIS — E039 Hypothyroidism, unspecified: Secondary | ICD-10-CM

## 2022-09-28 DIAGNOSIS — R600 Localized edema: Secondary | ICD-10-CM
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
## 2022-09-30 LAB — BASIC METABOLIC PANEL
BUN: 15 (ref 4–21)
CO2: 25 — AB (ref 13–22)
Chloride: 109 — AB (ref 99–108)
Creatinine: 1 (ref 0.5–1.1)
Glucose: 90
Potassium: 3.8 mEq/L (ref 3.5–5.1)
Sodium: 141 (ref 137–147)

## 2022-09-30 LAB — COMPREHENSIVE METABOLIC PANEL
Calcium: 9.1 (ref 8.7–10.7)
eGFR: 55

## 2022-10-18 ENCOUNTER — Non-Acute Institutional Stay (SKILLED_NURSING_FACILITY): Payer: Medicare Other | Admitting: Student

## 2022-10-18 ENCOUNTER — Encounter: Payer: Self-pay | Admitting: Student

## 2022-10-18 DIAGNOSIS — N3001 Acute cystitis with hematuria: Secondary | ICD-10-CM

## 2022-10-18 DIAGNOSIS — F22 Delusional disorders: Secondary | ICD-10-CM

## 2022-10-18 DIAGNOSIS — R410 Disorientation, unspecified: Secondary | ICD-10-CM
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
## 2022-10-21 ENCOUNTER — Non-Acute Institutional Stay (SKILLED_NURSING_FACILITY): Payer: Medicare Other | Admitting: Nurse Practitioner

## 2022-10-21 ENCOUNTER — Encounter: Payer: Self-pay | Admitting: Nurse Practitioner

## 2022-10-21 DIAGNOSIS — I10 Essential (primary) hypertension: Secondary | ICD-10-CM

## 2022-10-21 DIAGNOSIS — N1832 Chronic kidney disease, stage 3b: Secondary | ICD-10-CM | POA: Diagnosis not present

## 2022-10-21 DIAGNOSIS — E039 Hypothyroidism, unspecified: Secondary | ICD-10-CM | POA: Diagnosis not present

## 2022-10-21 DIAGNOSIS — N3001 Acute cystitis with hematuria: Secondary | ICD-10-CM | POA: Diagnosis not present

## 2022-10-21 DIAGNOSIS — R410 Disorientation, unspecified: Secondary | ICD-10-CM

## 2022-10-21 DIAGNOSIS — K521 Toxic gastroenteritis and colitis: Secondary | ICD-10-CM
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
## 2022-10-25 ENCOUNTER — Non-Acute Institutional Stay (SKILLED_NURSING_FACILITY): Payer: Medicare Other | Admitting: Student

## 2022-10-25 ENCOUNTER — Encounter: Payer: Self-pay | Admitting: Student

## 2022-10-25 DIAGNOSIS — F22 Delusional disorders: Secondary | ICD-10-CM | POA: Diagnosis not present

## 2022-10-25 DIAGNOSIS — F03B2 Unspecified dementia, moderate, with psychotic disturbance: Secondary | ICD-10-CM | POA: Diagnosis not present
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
## 2022-11-02 ENCOUNTER — Other Ambulatory Visit: Payer: Self-pay | Admitting: Nurse Practitioner

## 2022-11-02 DIAGNOSIS — F419 Anxiety disorder, unspecified: Secondary | ICD-10-CM

## 2022-11-02 DIAGNOSIS — R451 Restlessness and agitation: Secondary | ICD-10-CM

## 2022-11-02 MED ORDER — LORAZEPAM 1 MG PO TABS: ORAL_TABLET | ORAL | 0 refills | Status: DC

## 2022-11-03 ENCOUNTER — Encounter: Payer: Self-pay | Admitting: Student

## 2022-11-03 ENCOUNTER — Non-Acute Institutional Stay: Payer: Medicare Other | Admitting: Student

## 2022-11-03 DIAGNOSIS — E039 Hypothyroidism, unspecified: Secondary | ICD-10-CM

## 2022-11-03 DIAGNOSIS — I071 Rheumatic tricuspid insufficiency: Secondary | ICD-10-CM

## 2022-11-03 DIAGNOSIS — I340 Nonrheumatic mitral (valve) insufficiency: Secondary | ICD-10-CM | POA: Diagnosis not present

## 2022-11-03 DIAGNOSIS — I10 Essential (primary) hypertension: Secondary | ICD-10-CM | POA: Diagnosis not present

## 2022-11-03 DIAGNOSIS — F03B2 Unspecified dementia, moderate, with psychotic disturbance: Secondary | ICD-10-CM

## 2022-11-03 DIAGNOSIS — N1832 Chronic kidney disease, stage 3b: Secondary | ICD-10-CM

## 2022-11-03 DIAGNOSIS — E875 Hyperkalemia: Secondary | ICD-10-CM

## 2022-11-03 DIAGNOSIS — J479 Bronchiectasis, uncomplicated: Secondary | ICD-10-CM
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
## 2022-11-04 LAB — BASIC METABOLIC PANEL
BUN: 21 (ref 4–21)
CO2: 30 — AB (ref 13–22)
Chloride: 107 (ref 99–108)
Creatinine: 1.1 (ref 0.5–1.1)
Glucose: 61
Potassium: 4.2 mEq/L (ref 3.5–5.1)
Sodium: 142 (ref 137–147)

## 2022-11-04 LAB — CBC AND DIFFERENTIAL
HCT: 34 — AB (ref 36–46)
Hemoglobin: 11.5 — AB (ref 12.0–16.0)
Neutrophils Absolute: 3903
Platelets: 178 10*3/uL (ref 150–400)
WBC: 8.2

## 2022-11-04 LAB — HEPATIC FUNCTION PANEL
ALT: 21 U/L (ref 7–35)
AST: 33 (ref 13–35)
Alkaline Phosphatase: 125 (ref 25–125)
Bilirubin, Total: 0.5

## 2022-11-04 LAB — TSH: TSH: 2.42 (ref 0.41–5.90)

## 2022-11-04 LAB — COMPREHENSIVE METABOLIC PANEL
Albumin: 2.8 — AB (ref 3.5–5.0)
Calcium: 9 (ref 8.7–10.7)
Globulin: 2.9
eGFR: 47

## 2022-11-04 LAB — CBC: RBC: 3.59 — AB (ref 3.87–5.11)

## 2022-11-07 ENCOUNTER — Encounter: Payer: Self-pay | Admitting: Student

## 2022-11-10 ENCOUNTER — Telehealth: Payer: Medicare Other | Admitting: Student

## 2022-11-10 DIAGNOSIS — K219 Gastro-esophageal reflux disease without esophagitis: Secondary | ICD-10-CM

## 2022-11-10 MED ORDER — FAMOTIDINE 20 MG PO TABS: 20.0000 mg | ORAL_TABLET | Freq: Two times a day (BID) | ORAL | Status: DC

## 2022-11-10 NOTE — Telephone Encounter (Signed)
Received call as provider on call. Patient has not taken full tablets due to delusions. They are being crushed and given to her. Protonix cannot be crushed. Will change to famotidine 20 mg BID. If insufficient will increase dose to 40 mg BID.

## 2022-11-16 ENCOUNTER — Other Ambulatory Visit: Payer: Self-pay | Admitting: Nurse Practitioner

## 2022-11-16 MED ORDER — LORAZEPAM POWD: 1 refills | Status: DC

## 2022-11-16 MED ORDER — LORAZEPAM 1 MG PO TABS: ORAL_TABLET | ORAL | 1 refills | Status: DC

## 2022-11-30 ENCOUNTER — Other Ambulatory Visit: Payer: Self-pay | Admitting: Nurse Practitioner

## 2022-11-30 MED ORDER — LORAZEPAM 1 MG PO TABS: ORAL_TABLET | ORAL | 0 refills | Status: DC

## 2022-12-02 ENCOUNTER — Encounter: Payer: Self-pay | Admitting: Nurse Practitioner

## 2022-12-02 ENCOUNTER — Non-Acute Institutional Stay (SKILLED_NURSING_FACILITY): Payer: Medicare Other | Admitting: Nurse Practitioner

## 2022-12-02 DIAGNOSIS — F03B2 Unspecified dementia, moderate, with psychotic disturbance: Secondary | ICD-10-CM

## 2022-12-02 DIAGNOSIS — R5383 Other fatigue: Secondary | ICD-10-CM

## 2022-12-02 DIAGNOSIS — I10 Essential (primary) hypertension: Secondary | ICD-10-CM

## 2022-12-02 DIAGNOSIS — R630 Anorexia: Secondary | ICD-10-CM

## 2022-12-02 DIAGNOSIS — K580 Irritable bowel syndrome with diarrhea: Secondary | ICD-10-CM
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
## 2022-12-03 LAB — BASIC METABOLIC PANEL
BUN: 21 (ref 4–21)
CO2: 28 — AB (ref 13–22)
Chloride: 103 (ref 99–108)
Creatinine: 1.1 (ref 0.5–1.1)
Glucose: 77
Potassium: 4 mEq/L (ref 3.5–5.1)
Sodium: 138 (ref 137–147)

## 2022-12-03 LAB — COMPREHENSIVE METABOLIC PANEL
Albumin: 2.7 — AB (ref 3.5–5.0)
Calcium: 8.8 (ref 8.7–10.7)
Globulin: 3
eGFR: 50

## 2022-12-03 LAB — CBC AND DIFFERENTIAL
HCT: 36 (ref 36–46)
Hemoglobin: 12.3 (ref 12.0–16.0)
Neutrophils Absolute: 6053
Platelets: 221 10*3/uL (ref 150–400)
WBC: 9.7

## 2022-12-03 LAB — HEPATIC FUNCTION PANEL
ALT: 21 U/L (ref 7–35)
AST: 28 (ref 13–35)
Alkaline Phosphatase: 168 — AB (ref 25–125)
Bilirubin, Total: 0.9

## 2022-12-03 LAB — CBC: RBC: 3.87 (ref 3.87–5.11)

## 2022-12-24 NOTE — Telephone Encounter (Signed)
This encounter was created in error - please disregard.

## 2022-12-24 NOTE — Telephone Encounter (Signed)
Refill request received from pharmacy for lorazepam 0.5mg /ml. Apply 1 ml topically twice daily.  Old script says 180 tablet. Prescription needs to be re written.  Medication pended and sent to Sherrie Mustache, NP

## 2023-01-17 ENCOUNTER — Encounter: Payer: Self-pay | Admitting: Nurse Practitioner
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
## 2023-01-27 ENCOUNTER — Encounter: Payer: Self-pay | Admitting: Nurse Practitioner

## 2023-01-27 ENCOUNTER — Non-Acute Institutional Stay (SKILLED_NURSING_FACILITY): Payer: Medicare Other | Admitting: Nurse Practitioner

## 2023-01-27 DIAGNOSIS — E782 Mixed hyperlipidemia: Secondary | ICD-10-CM

## 2023-01-27 DIAGNOSIS — K219 Gastro-esophageal reflux disease without esophagitis: Secondary | ICD-10-CM | POA: Diagnosis not present

## 2023-01-27 DIAGNOSIS — N1832 Chronic kidney disease, stage 3b: Secondary | ICD-10-CM

## 2023-01-27 DIAGNOSIS — F03B2 Unspecified dementia, moderate, with psychotic disturbance: Secondary | ICD-10-CM | POA: Diagnosis not present

## 2023-01-27 DIAGNOSIS — E039 Hypothyroidism, unspecified: Secondary | ICD-10-CM | POA: Diagnosis not present

## 2023-01-27 DIAGNOSIS — I10 Essential (primary) hypertension: Secondary | ICD-10-CM | POA: Diagnosis not present
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
## 2023-02-02 ENCOUNTER — Other Ambulatory Visit: Payer: Self-pay | Admitting: Nurse Practitioner

## 2023-02-02 MED ORDER — LORAZEPAM 0.5 MG PO TABS: ORAL_TABLET | ORAL | 1 refills | Status: DC

## 2023-02-03 LAB — CBC AND DIFFERENTIAL
HCT: 31 — AB (ref 36–46)
Hemoglobin: 10.6 — AB (ref 12.0–16.0)
Neutrophils Absolute: 2702
Platelets: 179 10*3/uL (ref 150–400)
WBC: 5.7

## 2023-02-03 LAB — BASIC METABOLIC PANEL
BUN: 27 — AB (ref 4–21)
CO2: 31 — AB (ref 13–22)
Chloride: 108 (ref 99–108)
Creatinine: 1 (ref 0.5–1.1)
Glucose: 68
Potassium: 3.8 mEq/L (ref 3.5–5.1)
Sodium: 143 (ref 137–147)

## 2023-02-03 LAB — HEPATIC FUNCTION PANEL
ALT: 25 U/L (ref 7–35)
AST: 9 — AB (ref 13–35)
Alkaline Phosphatase: 186 — AB (ref 25–125)
Bilirubin, Total: 0.4

## 2023-02-03 LAB — COMPREHENSIVE METABOLIC PANEL
Albumin: 2.6 — AB (ref 3.5–5.0)
Calcium: 9.4 (ref 8.7–10.7)
Globulin: 2.9
eGFR: 57

## 2023-02-03 LAB — LIPID PANEL
Cholesterol: 122 (ref 0–200)
HDL: 57 (ref 35–70)
LDL Cholesterol: 53
Triglycerides: 50 (ref 40–160)

## 2023-02-03 LAB — TSH: TSH: 0.69 (ref 0.41–5.90)

## 2023-02-03 LAB — CBC: RBC: 3.36 — AB (ref 3.87–5.11)

## 2023-02-17 ENCOUNTER — Encounter: Payer: Self-pay | Admitting: Adult Health

## 2023-02-17 ENCOUNTER — Non-Acute Institutional Stay (SKILLED_NURSING_FACILITY): Payer: Medicare Other | Admitting: Adult Health

## 2023-02-17 DIAGNOSIS — F419 Anxiety disorder, unspecified: Secondary | ICD-10-CM

## 2023-02-17 DIAGNOSIS — F03B2 Unspecified dementia, moderate, with psychotic disturbance: Secondary | ICD-10-CM

## 2023-02-17 DIAGNOSIS — R630 Anorexia: Secondary | ICD-10-CM | POA: Diagnosis not present
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
## 2023-02-18 ENCOUNTER — Other Ambulatory Visit: Payer: Self-pay | Admitting: Student

## 2023-02-18 DIAGNOSIS — F03B11 Unspecified dementia, moderate, with agitation: Secondary | ICD-10-CM

## 2023-02-18 MED ORDER — OLANZAPINE 5 MG PO TABS: 5.0000 mg | ORAL_TABLET | Freq: Two times a day (BID) | ORAL | 2 refills | Status: DC

## 2023-02-18 MED ORDER — LORAZEPAM 0.5 MG PO TABS: ORAL_TABLET | ORAL | 1 refills | Status: DC

## 2023-02-18 NOTE — Progress Notes (Signed)
Refill of chronic med for dementia with delusions, agitation, and anxiety.

## 2023-03-01 ENCOUNTER — Non-Acute Institutional Stay (INDEPENDENT_AMBULATORY_CARE_PROVIDER_SITE_OTHER): Payer: Medicare Other | Admitting: Nurse Practitioner

## 2023-03-01 ENCOUNTER — Encounter: Payer: Self-pay | Admitting: Nurse Practitioner

## 2023-03-01 DIAGNOSIS — Z0000 Encounter for general adult medical examination without abnormal findings: Secondary | ICD-10-CM
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
## 2023-03-01 NOTE — Patient Instructions (Signed)
  Ms. Piester , Thank you for taking time to come for your Medicare Wellness Visit. I appreciate your ongoing commitment to your health goals. Please review the following plan we discussed and let me know if I can assist you in the future.   These are the goals we discussed:  Goals   None     This is a list of the screening recommended for you and due dates:  Health Maintenance  Topic Date Due   DEXA scan (bone density measurement)  Never done   Flu Shot  05/05/2023   Medicare Annual Wellness Visit  02/29/2024   DTaP/Tdap/Td vaccine (3 - Td or Tdap) 07/29/2031   Pneumonia Vaccine  Completed   COVID-19 Vaccine  Completed   Zoster (Shingles) Vaccine  Completed   HPV Vaccine  Aged Out

## 2023-03-07 LAB — CBC AND DIFFERENTIAL
HCT: 33 — AB (ref 36–46)
Hemoglobin: 10.6 — AB (ref 12.0–16.0)
Neutrophils Absolute: 3003
Platelets: 159 10*3/uL (ref 150–400)
WBC: 5.9

## 2023-03-07 LAB — CBC: RBC: 3.43 — AB (ref 3.87–5.11)

## 2023-04-06 ENCOUNTER — Encounter: Payer: Self-pay | Admitting: Student

## 2023-04-06 ENCOUNTER — Non-Acute Institutional Stay: Payer: Medicare Other | Admitting: Student

## 2023-04-06 DIAGNOSIS — Z66 Do not resuscitate: Secondary | ICD-10-CM

## 2023-04-06 DIAGNOSIS — K219 Gastro-esophageal reflux disease without esophagitis: Secondary | ICD-10-CM | POA: Diagnosis not present

## 2023-04-06 DIAGNOSIS — N1832 Chronic kidney disease, stage 3b: Secondary | ICD-10-CM

## 2023-04-06 DIAGNOSIS — E039 Hypothyroidism, unspecified: Secondary | ICD-10-CM

## 2023-04-06 DIAGNOSIS — I10 Essential (primary) hypertension: Secondary | ICD-10-CM

## 2023-04-06 DIAGNOSIS — F03B2 Unspecified dementia, moderate, with psychotic disturbance: Secondary | ICD-10-CM | POA: Diagnosis not present
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
## 2023-04-11 LAB — CBC AND DIFFERENTIAL
HCT: 33 — AB (ref 36–46)
Hemoglobin: 10.7 — AB (ref 12.0–16.0)
Neutrophils Absolute: 3416
Platelets: 177 10*3/uL (ref 150–400)
WBC: 7.8

## 2023-04-11 LAB — CBC: RBC: 3.45 — AB (ref 3.87–5.11)

## 2023-04-27 ENCOUNTER — Non-Acute Institutional Stay: Payer: Medicare Other | Admitting: Student

## 2023-04-27 ENCOUNTER — Encounter: Payer: Self-pay | Admitting: Student

## 2023-04-27 DIAGNOSIS — K219 Gastro-esophageal reflux disease without esophagitis: Secondary | ICD-10-CM | POA: Diagnosis not present

## 2023-04-27 DIAGNOSIS — E039 Hypothyroidism, unspecified: Secondary | ICD-10-CM | POA: Diagnosis not present

## 2023-04-27 DIAGNOSIS — N1832 Chronic kidney disease, stage 3b: Secondary | ICD-10-CM

## 2023-04-27 DIAGNOSIS — F22 Delusional disorders: Secondary | ICD-10-CM

## 2023-04-27 DIAGNOSIS — R296 Repeated falls: Secondary | ICD-10-CM

## 2023-04-27 DIAGNOSIS — F03B11 Unspecified dementia, moderate, with agitation: Secondary | ICD-10-CM

## 2023-04-27 DIAGNOSIS — I10 Essential (primary) hypertension: Secondary | ICD-10-CM | POA: Diagnosis not present

## 2023-04-27 DIAGNOSIS — R630 Anorexia: Secondary | ICD-10-CM
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
## 2023-04-28 ENCOUNTER — Encounter: Payer: Self-pay | Admitting: Student

## 2023-04-28 DIAGNOSIS — F03B11 Unspecified dementia, moderate, with agitation: Secondary | ICD-10-CM | POA: Insufficient documentation

## 2023-04-28 DIAGNOSIS — R296 Repeated falls: Secondary | ICD-10-CM | POA: Insufficient documentation

## 2023-04-28 DIAGNOSIS — F22 Delusional disorders: Secondary | ICD-10-CM | POA: Insufficient documentation

## 2023-04-28 DIAGNOSIS — R630 Anorexia: Secondary | ICD-10-CM | POA: Insufficient documentation

## 2023-04-28 DIAGNOSIS — K219 Gastro-esophageal reflux disease without esophagitis: Secondary | ICD-10-CM | POA: Insufficient documentation

## 2023-04-28 MED ORDER — OLANZAPINE 5 MG PO TABS: 5.0000 mg | ORAL_TABLET | Freq: Every day | ORAL | 2 refills | Status: DC | PRN

## 2023-06-09 LAB — CBC AND DIFFERENTIAL
HCT: 32 — AB (ref 36–46)
Hemoglobin: 10.6 — AB (ref 12.0–16.0)
Neutrophils Absolute: 4350
Platelets: 259 10*3/uL (ref 150–400)
WBC: 8.7

## 2023-06-09 LAB — CBC: RBC: 3.29 — AB (ref 3.87–5.11)

## 2023-07-11 LAB — CBC AND DIFFERENTIAL
HCT: 36 (ref 36–46)
Hemoglobin: 11.7 — AB (ref 12.0–16.0)
Platelets: 254 10*3/uL (ref 150–400)
WBC: 8.7

## 2023-07-11 LAB — CBC: RBC: 3.66 — AB (ref 3.87–5.11)

## 2023-07-12 ENCOUNTER — Non-Acute Institutional Stay: Payer: Medicare Other | Admitting: Nurse Practitioner

## 2023-07-12 ENCOUNTER — Encounter: Payer: Self-pay | Admitting: Nurse Practitioner

## 2023-07-12 DIAGNOSIS — D509 Iron deficiency anemia, unspecified: Secondary | ICD-10-CM

## 2023-07-12 DIAGNOSIS — R630 Anorexia: Secondary | ICD-10-CM

## 2023-07-12 DIAGNOSIS — N1832 Chronic kidney disease, stage 3b: Secondary | ICD-10-CM | POA: Diagnosis not present

## 2023-07-12 DIAGNOSIS — K219 Gastro-esophageal reflux disease without esophagitis: Secondary | ICD-10-CM | POA: Diagnosis not present

## 2023-07-12 DIAGNOSIS — E039 Hypothyroidism, unspecified: Secondary | ICD-10-CM

## 2023-07-12 DIAGNOSIS — F03B2 Unspecified dementia, moderate, with psychotic disturbance: Secondary | ICD-10-CM

## 2023-07-12 DIAGNOSIS — I951 Orthostatic hypotension: Secondary | ICD-10-CM
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
## 2023-08-02 ENCOUNTER — Encounter: Payer: Self-pay | Admitting: Nurse Practitioner

## 2023-08-02 ENCOUNTER — Non-Acute Institutional Stay: Payer: Medicare Other | Admitting: Nurse Practitioner

## 2023-08-02 DIAGNOSIS — F03B2 Unspecified dementia, moderate, with psychotic disturbance: Secondary | ICD-10-CM

## 2023-08-02 DIAGNOSIS — R296 Repeated falls: Secondary | ICD-10-CM

## 2023-08-02 DIAGNOSIS — R58 Hemorrhage, not elsewhere classified: Secondary | ICD-10-CM
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
## 2023-08-08 LAB — BASIC METABOLIC PANEL
BUN: 21 (ref 4–21)
CO2: 28 — AB (ref 13–22)
Chloride: 110 — AB (ref 99–108)
Creatinine: 1 (ref 0.5–1.1)
Glucose: 77
Potassium: 4.4 meq/L (ref 3.5–5.1)
Sodium: 141 (ref 137–147)

## 2023-08-08 LAB — COMPREHENSIVE METABOLIC PANEL
Albumin: 2.6 — AB (ref 3.5–5.0)
Calcium: 8.7 (ref 8.7–10.7)
Globulin: 2.9
eGFR: 57

## 2023-08-08 LAB — CBC AND DIFFERENTIAL
HCT: 33 — AB (ref 36–46)
Hemoglobin: 10.8 — AB (ref 12.0–16.0)
Neutrophils Absolute: 3543
Platelets: 223 10*3/uL (ref 150–400)
WBC: 7.1

## 2023-08-08 LAB — HEPATIC FUNCTION PANEL
ALT: 14 U/L (ref 7–35)
AST: 23 (ref 13–35)
Alkaline Phosphatase: 141 — AB (ref 25–125)
Bilirubin, Total: 0.4

## 2023-08-08 LAB — CBC: RBC: 3.3 — AB (ref 3.87–5.11)

## 2023-09-05 LAB — CBC AND DIFFERENTIAL
HCT: 34 — AB (ref 36–46)
Hemoglobin: 11.1 — AB (ref 12.0–16.0)
Neutrophils Absolute: 3308
Platelets: 175 10*3/uL (ref 150–400)
WBC: 7.5

## 2023-09-05 LAB — CBC: RBC: 3.44 — AB (ref 3.87–5.11)

## 2023-09-12 ENCOUNTER — Encounter: Payer: Self-pay | Admitting: Student

## 2023-09-12 ENCOUNTER — Non-Acute Institutional Stay: Payer: Self-pay | Admitting: Student

## 2023-09-12 DIAGNOSIS — F03B2 Unspecified dementia, moderate, with psychotic disturbance: Secondary | ICD-10-CM

## 2023-09-12 DIAGNOSIS — I071 Rheumatic tricuspid insufficiency: Secondary | ICD-10-CM

## 2023-09-12 DIAGNOSIS — I340 Nonrheumatic mitral (valve) insufficiency: Secondary | ICD-10-CM

## 2023-09-12 DIAGNOSIS — D509 Iron deficiency anemia, unspecified: Secondary | ICD-10-CM

## 2023-09-12 DIAGNOSIS — E039 Hypothyroidism, unspecified: Secondary | ICD-10-CM | POA: Diagnosis not present

## 2023-09-12 DIAGNOSIS — N1832 Chronic kidney disease, stage 3b: Secondary | ICD-10-CM
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
## 2023-09-19 LAB — BASIC METABOLIC PANEL
BUN: 27 — AB (ref 4–21)
CO2: 29 — AB (ref 13–22)
Chloride: 104 (ref 99–108)
Creatinine: 1.2 — AB (ref 0.5–1.1)
Glucose: 74
Potassium: 3.8 meq/L (ref 3.5–5.1)
Sodium: 140 (ref 137–147)

## 2023-09-19 LAB — COMPREHENSIVE METABOLIC PANEL
Calcium: 8.9 (ref 8.7–10.7)
eGFR: 44

## 2023-09-29 ENCOUNTER — Non-Acute Institutional Stay: Payer: Medicare Other | Admitting: Nurse Practitioner

## 2023-09-29 ENCOUNTER — Encounter: Payer: Self-pay | Admitting: Nurse Practitioner

## 2023-09-29 DIAGNOSIS — J209 Acute bronchitis, unspecified: Secondary | ICD-10-CM | POA: Diagnosis not present
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
## 2023-10-13 ENCOUNTER — Other Ambulatory Visit: Payer: Self-pay | Admitting: Nurse Practitioner

## 2023-10-13 ENCOUNTER — Encounter: Payer: Self-pay | Admitting: Nurse Practitioner

## 2023-10-13 ENCOUNTER — Non-Acute Institutional Stay: Payer: Medicare Other | Admitting: Nurse Practitioner

## 2023-10-13 DIAGNOSIS — R0989 Other specified symptoms and signs involving the circulatory and respiratory systems: Secondary | ICD-10-CM

## 2023-10-13 DIAGNOSIS — I499 Cardiac arrhythmia, unspecified: Secondary | ICD-10-CM | POA: Diagnosis not present
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
## 2023-10-14 ENCOUNTER — Encounter: Payer: Self-pay | Admitting: Student

## 2023-10-14 ENCOUNTER — Non-Acute Institutional Stay: Payer: Medicare Other | Admitting: Student

## 2023-10-14 DIAGNOSIS — F22 Delusional disorders: Secondary | ICD-10-CM | POA: Diagnosis not present

## 2023-10-14 DIAGNOSIS — N1832 Chronic kidney disease, stage 3b: Secondary | ICD-10-CM | POA: Diagnosis not present

## 2023-10-14 DIAGNOSIS — F03B2 Unspecified dementia, moderate, with psychotic disturbance: Secondary | ICD-10-CM

## 2023-10-14 DIAGNOSIS — I4891 Unspecified atrial fibrillation: Secondary | ICD-10-CM | POA: Diagnosis not present

## 2023-10-14 DIAGNOSIS — J479 Bronchiectasis, uncomplicated: Secondary | ICD-10-CM

## 2023-10-14 DIAGNOSIS — I6523 Occlusion and stenosis of bilateral carotid arteries: Secondary | ICD-10-CM | POA: Diagnosis not present
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
## 2023-10-19 ENCOUNTER — Encounter: Payer: Self-pay | Admitting: Student

## 2023-10-19 ENCOUNTER — Non-Acute Institutional Stay: Payer: Medicare Other | Admitting: Student

## 2023-10-19 DIAGNOSIS — I4891 Unspecified atrial fibrillation: Secondary | ICD-10-CM
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
## 2023-10-20 ENCOUNTER — Encounter: Payer: Self-pay | Admitting: Student

## 2023-11-21 LAB — BASIC METABOLIC PANEL
BUN: 22 — AB (ref 4–21)
CO2: 27 — AB (ref 13–22)
Chloride: 102 (ref 99–108)
Creatinine: 1.1 (ref 0.5–1.1)
Glucose: 162
Potassium: 3.6 meq/L (ref 3.5–5.1)
Sodium: 138 (ref 137–147)

## 2023-11-21 LAB — COMPREHENSIVE METABOLIC PANEL: Calcium: 9.2 (ref 8.7–10.7)

## 2023-12-06 ENCOUNTER — Non-Acute Institutional Stay: Payer: Self-pay | Admitting: Nurse Practitioner

## 2023-12-06 ENCOUNTER — Encounter: Payer: Self-pay | Admitting: Nurse Practitioner

## 2023-12-06 DIAGNOSIS — N1832 Chronic kidney disease, stage 3b: Secondary | ICD-10-CM | POA: Diagnosis not present

## 2023-12-06 DIAGNOSIS — G603 Idiopathic progressive neuropathy: Secondary | ICD-10-CM

## 2023-12-06 DIAGNOSIS — I4891 Unspecified atrial fibrillation: Secondary | ICD-10-CM | POA: Insufficient documentation

## 2023-12-06 DIAGNOSIS — F03B11 Unspecified dementia, moderate, with agitation: Secondary | ICD-10-CM

## 2023-12-06 DIAGNOSIS — E782 Mixed hyperlipidemia: Secondary | ICD-10-CM

## 2023-12-06 DIAGNOSIS — I10 Essential (primary) hypertension: Secondary | ICD-10-CM | POA: Diagnosis not present

## 2023-12-06 DIAGNOSIS — R600 Localized edema: Secondary | ICD-10-CM

## 2023-12-06 DIAGNOSIS — K219 Gastro-esophageal reflux disease without esophagitis: Secondary | ICD-10-CM

## 2023-12-06 DIAGNOSIS — E039 Hypothyroidism, unspecified: Secondary | ICD-10-CM

## 2023-12-06 DIAGNOSIS — D649 Anemia, unspecified: Secondary | ICD-10-CM

## 2023-12-06 DIAGNOSIS — I959 Hypotension, unspecified: Secondary | ICD-10-CM | POA: Insufficient documentation

## 2023-12-06 DIAGNOSIS — I4819 Other persistent atrial fibrillation: Secondary | ICD-10-CM

## 2023-12-06 NOTE — Assessment & Plan Note (Signed)
 TSH stable in May 2024, will recheck with next labs, continues on synthroid 100 mcg daily

## 2023-12-06 NOTE — Assessment & Plan Note (Signed)
 LDL at goal on last lab Continues on lipitor 40 mg daily Check lipids yearly

## 2023-12-06 NOTE — Assessment & Plan Note (Signed)
Controlled on pepcid daily

## 2023-12-06 NOTE — Assessment & Plan Note (Signed)
 Rate controlled. Continues on metoprolol with eliquis for anticoagulation.
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
## 2023-12-06 NOTE — Assessment & Plan Note (Signed)
 Chronic and stable Encourage proper hydration Follow metabolic panel Avoid nephrotoxic meds (NSAIDS)

## 2023-12-06 NOTE — Assessment & Plan Note (Signed)
 Stable, Continues on midodrine 5 mg TID.

## 2023-12-06 NOTE — Assessment & Plan Note (Signed)
 Stable, no acute changes in cognitive or functional status, however ongoing agitaton with increase in aggressive behaviors. Ill increase seroquel to 50 mg twice daily at this time and continue supportive care.

## 2023-12-06 NOTE — Assessment & Plan Note (Signed)
 Continues on supplement. CBC scheduled monthly to monitor.

## 2023-12-06 NOTE — Assessment & Plan Note (Signed)
 Well controlled on lasix and compression hose.

## 2023-12-06 NOTE — Assessment & Plan Note (Signed)
 Stable, no currently complaints of pain. Not on current medications. Will monitor.

## 2023-12-08 LAB — CBC AND DIFFERENTIAL
HCT: 34 — AB (ref 36–46)
Hemoglobin: 11.5 — AB (ref 12.0–16.0)
Neutrophils Absolute: 2892
Platelets: 202 10*3/uL (ref 150–400)
WBC: 6.3

## 2023-12-08 LAB — CBC: RBC: 3.61 — AB (ref 3.87–5.11)

## 2023-12-08 LAB — TSH: TSH: 4.69 (ref 0.41–5.90)

## 2023-12-15 LAB — CBC AND DIFFERENTIAL
HCT: 35 — AB (ref 36–46)
Hemoglobin: 11.8 — AB (ref 12.0–16.0)
Platelets: 200 10*3/uL (ref 150–400)
WBC: 6.5

## 2023-12-15 LAB — CBC: RBC: 3.71 — AB (ref 3.87–5.11)

## 2023-12-15 LAB — IRON,TIBC AND FERRITIN PANEL
%SAT: 47
Iron: 109
TIBC: 230

## 2023-12-15 LAB — VITAMIN B12: Vitamin B-12: 478

## 2024-01-05 LAB — BASIC METABOLIC PANEL WITH GFR
BUN: 38 — AB (ref 4–21)
CO2: 32 — AB (ref 13–22)
Chloride: 104 (ref 99–108)
Creatinine: 1.1 (ref 0.5–1.1)
Glucose: 66
Potassium: 3.7 meq/L (ref 3.5–5.1)
Sodium: 143 (ref 137–147)

## 2024-01-05 LAB — COMPREHENSIVE METABOLIC PANEL WITH GFR
Calcium: 9.2 (ref 8.7–10.7)
eGFR: 48

## 2024-01-11 ENCOUNTER — Non-Acute Institutional Stay: Payer: Self-pay | Admitting: Student

## 2024-01-11 ENCOUNTER — Encounter: Payer: Self-pay | Admitting: Student

## 2024-01-11 DIAGNOSIS — I959 Hypotension, unspecified: Secondary | ICD-10-CM

## 2024-01-11 DIAGNOSIS — R600 Localized edema: Secondary | ICD-10-CM

## 2024-01-11 DIAGNOSIS — I071 Rheumatic tricuspid insufficiency: Secondary | ICD-10-CM

## 2024-01-11 DIAGNOSIS — I501 Left ventricular failure: Secondary | ICD-10-CM | POA: Diagnosis not present

## 2024-01-11 DIAGNOSIS — F03B11 Unspecified dementia, moderate, with agitation: Secondary | ICD-10-CM

## 2024-01-11 DIAGNOSIS — I340 Nonrheumatic mitral (valve) insufficiency: Secondary | ICD-10-CM | POA: Diagnosis not present

## 2024-01-11 DIAGNOSIS — I4819 Other persistent atrial fibrillation: Secondary | ICD-10-CM
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
## 2024-01-12 ENCOUNTER — Encounter: Payer: Self-pay | Admitting: Student

## 2024-01-12 DIAGNOSIS — I501 Left ventricular failure: Secondary | ICD-10-CM | POA: Insufficient documentation

## 2024-01-16 ENCOUNTER — Non-Acute Institutional Stay: Payer: Self-pay | Admitting: Student

## 2024-01-16 ENCOUNTER — Encounter: Payer: Self-pay | Admitting: Student

## 2024-01-16 DIAGNOSIS — I071 Rheumatic tricuspid insufficiency: Secondary | ICD-10-CM

## 2024-01-16 DIAGNOSIS — R600 Localized edema: Secondary | ICD-10-CM | POA: Diagnosis not present

## 2024-01-16 DIAGNOSIS — F03B11 Unspecified dementia, moderate, with agitation: Secondary | ICD-10-CM

## 2024-01-16 DIAGNOSIS — I340 Nonrheumatic mitral (valve) insufficiency: Secondary | ICD-10-CM
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
## 2024-01-17 ENCOUNTER — Encounter: Payer: Self-pay | Admitting: Student

## 2024-01-23 LAB — BASIC METABOLIC PANEL WITH GFR
BUN: 32 — AB (ref 4–21)
CO2: 35 — AB (ref 13–22)
Chloride: 100 (ref 99–108)
Creatinine: 1.4 — AB (ref 0.5–1.1)
Glucose: 67
Potassium: 3.9 meq/L (ref 3.5–5.1)
Sodium: 140 (ref 137–147)

## 2024-01-23 LAB — COMPREHENSIVE METABOLIC PANEL WITH GFR
Calcium: 9.1 (ref 8.7–10.7)
eGFR: 38

## 2024-02-16 LAB — CBC AND DIFFERENTIAL
HCT: 37 (ref 36–46)
Hemoglobin: 11.9 — AB (ref 12.0–16.0)
Neutrophils Absolute: 3250
Platelets: 188 10*3/uL (ref 150–400)
WBC: 6.5

## 2024-02-16 LAB — BASIC METABOLIC PANEL WITH GFR
BUN: 35 — AB (ref 4–21)
CO2: 28 — AB (ref 13–22)
Chloride: 103 (ref 99–108)
Creatinine: 1.2 — AB (ref 0.5–1.1)
Glucose: 75
Potassium: 3.8 meq/L (ref 3.5–5.1)
Sodium: 139 (ref 137–147)

## 2024-02-16 LAB — LIPID PANEL
Cholesterol: 109 (ref 0–200)
HDL: 41 (ref 35–70)
LDL Cholesterol: 55
Triglycerides: 57 (ref 40–160)

## 2024-02-16 LAB — COMPREHENSIVE METABOLIC PANEL WITH GFR
Albumin: 2.7 — AB (ref 3.5–5.0)
Calcium: 9.2 (ref 8.7–10.7)
Globulin: 3.8
eGFR: 42

## 2024-02-16 LAB — HEPATIC FUNCTION PANEL
ALT: 15 U/L (ref 7–35)
AST: 23 (ref 13–35)
Alkaline Phosphatase: 166 — AB (ref 25–125)
Bilirubin, Total: 0.8

## 2024-02-16 LAB — TSH: TSH: 2.39 (ref 0.41–5.90)

## 2024-02-16 LAB — CBC: RBC: 3.8 — AB (ref 3.87–5.11)

## 2024-03-29 ENCOUNTER — Non-Acute Institutional Stay: Payer: Self-pay | Admitting: Nurse Practitioner

## 2024-03-29 ENCOUNTER — Encounter: Payer: Self-pay | Admitting: Nurse Practitioner

## 2024-03-29 DIAGNOSIS — Z0000 Encounter for general adult medical examination without abnormal findings: Secondary | ICD-10-CM
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
## 2024-04-09 ENCOUNTER — Non-Acute Institutional Stay: Payer: Self-pay | Admitting: Adult Health

## 2024-04-09 ENCOUNTER — Encounter: Payer: Self-pay | Admitting: Adult Health

## 2024-04-09 DIAGNOSIS — J189 Pneumonia, unspecified organism: Secondary | ICD-10-CM | POA: Diagnosis not present

## 2024-04-09 DIAGNOSIS — F03B11 Unspecified dementia, moderate, with agitation: Secondary | ICD-10-CM | POA: Diagnosis not present
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
## 2024-04-11 MED ORDER — SULFAMETHOXAZOLE-TRIMETHOPRIM 800-160 MG PO TABS: 1.0000 | ORAL_TABLET | Freq: Two times a day (BID) | ORAL | 0 refills | Status: AC

## 2024-04-19 ENCOUNTER — Encounter: Payer: Self-pay | Admitting: Adult Health

## 2024-04-19 ENCOUNTER — Non-Acute Institutional Stay: Payer: Self-pay | Admitting: Adult Health

## 2024-04-19 DIAGNOSIS — R600 Localized edema: Secondary | ICD-10-CM | POA: Diagnosis not present

## 2024-04-19 DIAGNOSIS — R634 Abnormal weight loss: Secondary | ICD-10-CM | POA: Diagnosis not present

## 2024-04-19 DIAGNOSIS — J189 Pneumonia, unspecified organism: Secondary | ICD-10-CM | POA: Diagnosis not present
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
## 2024-04-22 ENCOUNTER — Other Ambulatory Visit: Payer: Self-pay | Admitting: Orthopedic Surgery

## 2024-04-22 DIAGNOSIS — Z515 Encounter for palliative care: Secondary | ICD-10-CM

## 2024-04-22 MED ORDER — MORPHINE SULFATE (CONCENTRATE) 20 MG/ML PO SOLN: 5.0000 mg | Freq: Four times a day (QID) | ORAL | Status: DC | PRN

## 2024-04-22 NOTE — Progress Notes (Signed)
 Family requesting hospice consult. Respirations increased and ranging between 25-30. Orders given to start hospice consult and morphine  concentrate prn.

## 2024-04-23 ENCOUNTER — Encounter: Payer: Self-pay | Admitting: Student

## 2024-04-23 ENCOUNTER — Non-Acute Institutional Stay: Payer: Self-pay | Admitting: Student

## 2024-04-23 DIAGNOSIS — R634 Abnormal weight loss: Secondary | ICD-10-CM

## 2024-04-23 DIAGNOSIS — Z515 Encounter for palliative care: Secondary | ICD-10-CM | POA: Diagnosis not present

## 2024-04-23 DIAGNOSIS — I959 Hypotension, unspecified: Secondary | ICD-10-CM

## 2024-04-23 DIAGNOSIS — I340 Nonrheumatic mitral (valve) insufficiency: Secondary | ICD-10-CM

## 2024-04-23 DIAGNOSIS — I4819 Other persistent atrial fibrillation: Secondary | ICD-10-CM

## 2024-04-23 DIAGNOSIS — R600 Localized edema: Secondary | ICD-10-CM

## 2024-04-23 DIAGNOSIS — F03B11 Unspecified dementia, moderate, with agitation: Secondary | ICD-10-CM

## 2024-04-23 DIAGNOSIS — J189 Pneumonia, unspecified organism: Secondary | ICD-10-CM

## 2024-04-23 DIAGNOSIS — I071 Rheumatic tricuspid insufficiency: Secondary | ICD-10-CM

## 2024-04-23 MED ORDER — MORPHINE SULFATE (CONCENTRATE) 20 MG/ML PO SOLN: 5.0000 mg | ORAL | 0 refills | Status: DC | PRN

## 2024-04-23 MED ORDER — LORAZEPAM 1 MG PO TABS: 1.0000 mg | ORAL_TABLET | Freq: Three times a day (TID) | ORAL | 0 refills | Status: DC | PRN
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
## 2024-05-04 DEATH — deceased
